# Patient Record
Sex: Male | Born: 1945 | Race: White | Hispanic: No | Marital: Married | Smoking: Never smoker
Health system: Southern US, Community
[De-identification: ages and names within clinical notes are randomized; demographics above are authoritative.]

## PROBLEM LIST (undated history)

## (undated) DIAGNOSIS — E119 Type 2 diabetes mellitus without complications: Secondary | ICD-10-CM

## (undated) DIAGNOSIS — T7840XA Allergy, unspecified, initial encounter: Secondary | ICD-10-CM

## (undated) DIAGNOSIS — E785 Hyperlipidemia, unspecified: Secondary | ICD-10-CM

## (undated) DIAGNOSIS — I10 Essential (primary) hypertension: Secondary | ICD-10-CM

## (undated) DIAGNOSIS — G4730 Sleep apnea, unspecified: Secondary | ICD-10-CM

## (undated) DIAGNOSIS — F32A Depression, unspecified: Secondary | ICD-10-CM

## (undated) DIAGNOSIS — D649 Anemia, unspecified: Secondary | ICD-10-CM

## (undated) DIAGNOSIS — K219 Gastro-esophageal reflux disease without esophagitis: Secondary | ICD-10-CM

## (undated) DIAGNOSIS — F419 Anxiety disorder, unspecified: Secondary | ICD-10-CM

## (undated) HISTORY — DX: Sleep apnea, unspecified: G47.30

## (undated) HISTORY — DX: Anxiety disorder, unspecified: F41.9

## (undated) HISTORY — DX: Allergy, unspecified, initial encounter: T78.40XA

## (undated) HISTORY — DX: Type 2 diabetes mellitus without complications: E11.9

## (undated) HISTORY — DX: Hyperlipidemia, unspecified: E78.5

## (undated) HISTORY — PX: LUMBAR DISC SURGERY: SHX700

## (undated) HISTORY — DX: Depression, unspecified: F32.A

## (undated) HISTORY — DX: Essential (primary) hypertension: I10

## (undated) HISTORY — DX: Gastro-esophageal reflux disease without esophagitis: K21.9

## (undated) HISTORY — DX: Anemia, unspecified: D64.9

---
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
## 1969-06-11 DIAGNOSIS — Z87442 Personal history of urinary calculi: Secondary | ICD-10-CM

## 1969-06-11 HISTORY — DX: Personal history of urinary calculi: Z87.442

## 2020-11-18 LAB — CBC AND DIFFERENTIAL
HCT: 31 — AB (ref 41–53)
Hemoglobin: 9.8 — AB (ref 13.5–17.5)
Neutrophils Absolute: 5.14
Platelets: 404 — AB (ref 150–399)
WBC: 8.7

## 2020-11-18 LAB — BASIC METABOLIC PANEL
BUN: 12 (ref 4–21)
CO2: 24 — AB (ref 13–22)
Chloride: 102 (ref 99–108)
Creatinine: 1.2 (ref 0.6–1.3)
Glucose: 118
Potassium: 4.7 (ref 3.4–5.3)
Sodium: 138 (ref 137–147)

## 2020-11-18 LAB — HEPATIC FUNCTION PANEL
ALT: 7 — AB (ref 10–40)
AST: 13 — AB (ref 14–40)
Alkaline Phosphatase: 57 (ref 25–125)
Bilirubin, Total: 0.7

## 2020-11-18 LAB — HEMOGLOBIN A1C: Hemoglobin A1C: 6.7

## 2020-11-18 LAB — TSH: TSH: 1.17 (ref 0.41–5.90)

## 2020-11-18 LAB — COMPREHENSIVE METABOLIC PANEL
Albumin: 4.4 (ref 3.5–5.0)
Calcium: 10.3 (ref 8.7–10.7)
Globulin: 2.5

## 2020-11-18 LAB — CBC: RBC: 4.47 (ref 3.87–5.11)

## 2021-04-19 ENCOUNTER — Other Ambulatory Visit: Payer: Self-pay

## 2021-04-19 ENCOUNTER — Encounter: Payer: Self-pay | Admitting: Internal Medicine

## 2021-04-19 ENCOUNTER — Non-Acute Institutional Stay: Payer: Medicare Other | Admitting: Internal Medicine

## 2021-04-19 VITALS — BP 111/67 | HR 84 | Temp 96.3°F | Ht 71.0 in | Wt 181.6 lb

## 2021-04-19 DIAGNOSIS — R42 Dizziness and giddiness: Secondary | ICD-10-CM | POA: Diagnosis not present

## 2021-04-19 DIAGNOSIS — E118 Type 2 diabetes mellitus with unspecified complications: Secondary | ICD-10-CM

## 2021-04-19 DIAGNOSIS — R0602 Shortness of breath: Secondary | ICD-10-CM

## 2021-04-19 DIAGNOSIS — F419 Anxiety disorder, unspecified: Secondary | ICD-10-CM

## 2021-04-19 DIAGNOSIS — R350 Frequency of micturition: Secondary | ICD-10-CM

## 2021-04-19 DIAGNOSIS — N401 Enlarged prostate with lower urinary tract symptoms: Secondary | ICD-10-CM

## 2021-04-19 DIAGNOSIS — D649 Anemia, unspecified: Secondary | ICD-10-CM | POA: Diagnosis not present

## 2021-04-19 DIAGNOSIS — I10 Essential (primary) hypertension: Secondary | ICD-10-CM

## 2021-04-19 DIAGNOSIS — F339 Major depressive disorder, recurrent, unspecified: Secondary | ICD-10-CM

## 2021-04-19 DIAGNOSIS — K219 Gastro-esophageal reflux disease without esophagitis: Secondary | ICD-10-CM

## 2021-04-19 NOTE — Patient Instructions (Addendum)
Call with pharmacy info.  Meclizine 12.5 Mg TID  Prn for Dizziness. It will make you sleepy so don't drive after that for few hours. Let me know on next visit if it helps with dizziness Change you metformin to Half tablet twice a day. See if it helps GI symptoms

## 2021-04-20 MED ORDER — MECLIZINE HCL 12.5 MG PO TABS: 12.5000 mg | ORAL_TABLET | Freq: Three times a day (TID) | ORAL | 0 refills | Status: DC | PRN

## 2021-04-20 MED ORDER — ROSUVASTATIN CALCIUM 10 MG PO TABS: 10.0000 mg | ORAL_TABLET | Freq: Every day | ORAL | 3 refills | Status: DC

## 2021-04-20 NOTE — Progress Notes (Signed)
Location:  [place] of Service:  Clinic (12)  Provider:   Code Status:  Goals of Care:  Advanced Directives 04/18/2021  Does Patient Have a Medical Advance Directive? Yes  Type of Advance Directive Living will;Healthcare Power of Attorney  Does patient want to make changes to medical advance directive? No - Patient declined  Copy of [place] in Chart? No - copy requested     Chief Complaint  Patient presents with   Medical Management of Chronic Issues    Patient here today to establish care.     HPI: Patient is a 75 y.o. male seen today for medical management of chronic diseases.    Recent move to [facility] from [place] with his wife Here to establish care Dont have records from previous PCP Dizziness C/o Room Spinning Sometimes whole day Feels like he is going to fall. Has not fallen. Going on for past few months Worsen when he stands up or any activity Nausea and Abdominal Fullness Has been going on for past few years No change in bowel habit no Abdominal pain Weight is at baseline Appetite is good No Vomiting Fatigue. Feels like that all day SOB Only when he tries to move heavy objects No Chest pain or Cough or Fever or DOE Depression with anxiety. Has talked to psychologist before Stress due to his children and recent move from [place] frequency Goes to Bathroom at night 7-8 times H/o Diabetes Mellitus type 2 CBG at home have been around 100-120  Moved here with his wife Independent in his ADLS and [place] with no assist Very sedentary   Past Medical History:  Diagnosis Date   Diabetes mellitus without complication ([place])    Hyperlipidemia    Hypertension     Past Surgical History:  Procedure Laterality Date   APPENDECTOMY  1952    No Known Allergies  Outpatient Encounter Medications as of 04/19/2021  Medication Sig   meclizine (ANTIVERT) 12.5 MG tablet Take 1 tablet (12.5 mg total) by  mouth 3 (three) times daily as needed for dizziness.   metFORMIN (GLUCOPHAGE) 1000 MG tablet Take 500 mg by mouth 2 (two) times daily with a meal.   pantoprazole (PROTONIX) 40 MG tablet Take 40 mg by mouth daily.   ramipril (ALTACE) 5 MG capsule Take 5 mg by mouth daily.   rosuvastatin (CRESTOR) 10 MG tablet Take 1 tablet (10 mg total) by mouth daily.   sertraline (ZOLOFT) 50 MG tablet Take 50 mg by mouth daily.   tamsulosin (FLOMAX) 0.4 MG CAPS capsule Take 0.4 mg by mouth at bedtime.   [DISCONTINUED] meclizine (ANTIVERT) 12.5 MG tablet Take 1 tablet (12.5 mg total) by mouth 3 (three) times daily as needed for dizziness.   [DISCONTINUED] rosuvastatin (CRESTOR) 10 MG tablet Take 10 mg by mouth daily.   [DISCONTINUED] rosuvastatin (CRESTOR) 10 MG tablet Take 1 tablet (10 mg total) by mouth daily.   No facility-administered encounter medications on file as of 04/19/2021.    Review of Systems:  Review of Systems  Constitutional:  Positive for activity change.  HENT: Negative.    Respiratory:  Positive for shortness of breath.   Cardiovascular: Negative.   Gastrointestinal:  Positive for abdominal distention and nausea.  Genitourinary:  Positive for frequency and urgency.  Musculoskeletal: Negative.   Skin: Negative.   Neurological:  Positive for dizziness and weakness.  Psychiatric/Behavioral:  Positive for dysphoric mood and sleep disturbance. The patient is  nervous/anxious.    Health Maintenance  Topic Date Due   COVID-19 Vaccine (1) Never done   Pneumonia Vaccine 44+ Years old (1 - PCV) Never done   Hepatitis C Screening  Never done   TETANUS/TDAP  Never done   Zoster Vaccines- Shingrix (1 of 2) Never done   INFLUENZA VACCINE  01/09/2021   HPV VACCINES  Aged Out    Physical Exam: Vitals:   04/19/21 1536  BP: 111/67  Pulse: 84  Temp: (!) 96.3 F (35.7 C)  SpO2: 96%  Weight: 181 lb 9.6 oz (82.4 kg)  Height: 5\' 11"  (1.803 m)   Body mass index is 25.33 kg/m. Physical  Exam Vitals reviewed.  Constitutional:      Appearance: Normal appearance.  HENT:     Head: Normocephalic.     Nose: Nose normal.     Mouth/Throat:     Mouth: Mucous membranes are moist.     Pharynx: Oropharynx is clear.  Eyes:     Pupils: Pupils are equal, round, and reactive to light.  Cardiovascular:     Rate and Rhythm: Normal rate and regular rhythm.     Pulses: Normal pulses.     Heart sounds: Normal heart sounds. No murmur heard. Pulmonary:     Effort: Pulmonary effort is normal.     Breath sounds: Normal breath sounds.  Abdominal:     General: Abdomen is flat. Bowel sounds are normal.     Palpations: Abdomen is soft.  Musculoskeletal:        General: No swelling.     Cervical back: Neck supple.  Skin:    General: Skin is warm.  Neurological:     General: No focal deficit present.     Mental Status: He is alert and oriented to person, place, and time.     Comments: No Nystagmus Romberg was negative Did not feel dizzy when he stood up nor with the movement of his head   Psychiatric:        Mood and Affect: Mood normal.        Thought Content: Thought content normal.    Labs reviewed: Basic Metabolic Panel: Recent Labs    11/19/20 0000  NA 138  K 4.7  CL 102  CO2 24*  BUN 12  CREATININE 1.2  CALCIUM 10.3  TSH 1.17   Liver Function Tests: Recent Labs    11/19/20 0000  AST 13*  ALT 7*  ALKPHOS 57  ALBUMIN 4.4   No results for input(s): LIPASE, AMYLASE in the last 8760 hours. No results for input(s): AMMONIA in the last 8760 hours. CBC: Recent Labs    11/19/20 0000  WBC 8.7  NEUTROABS 5.14  HGB 9.8*  HCT 31*  PLT 404*   Lipid Panel: No results for input(s): CHOL, HDL, LDLCALC, TRIG, CHOLHDL, LDLDIRECT in the last 8760 hours. Lab Results  Component Value Date   HGBA1C 6.7 11/19/2020    Procedures since last visit: No results found.  Assessment/Plan 1. Anemia, unspecified type ? Etiology Has not worked up per Patient  No GI work  up per him - CBC with Differential/Platelet; Future - Iron, TIBC and Ferritin Panel; Future - Vitamin B12; Future - Folate RBC; Future  2. Dizziness ? BPPV Will try Meclizine PRN I fno Improvement Neurology Referal - COMPLETE METABOLIC PANEL WITH GFR; Future  3. Controlled type 2 diabetes mellitus with complication, without long-term current use of insulin (HCC) Reduce Metformin to 500 mg BID to see if  it helps Gi symptoms - Hemoglobin A1c; Future - Lipid panel; Future  4. Primary hypertension On Low dose of Ramipril  5. Depression, recurrent ([place]) Continue same dose of Zoloft  6. Anxiety Due to recent move and Personal Stress  7. SOB (shortness of breath)  NoN specific Symptoms Exam normal Will start with labs first  8. Benign prostatic hyperplasia with urinary frequency Will need Urology Referal as Flomax not helping  9. Gastroesophageal reflux disease, unspecified whether esophagitis present Continue Protonix 10 GI non specific Symptoms ? Reducing dose for Metformin If no Improvement Consider GI consult especially with Anemia 11. HLD On statin   Labs/tests ordered:  * No order type specified * Next appt:  04/27/2021

## 2021-04-27 ENCOUNTER — Other Ambulatory Visit: Payer: Self-pay

## 2021-04-27 DIAGNOSIS — R42 Dizziness and giddiness: Secondary | ICD-10-CM

## 2021-04-27 DIAGNOSIS — D649 Anemia, unspecified: Secondary | ICD-10-CM

## 2021-04-27 DIAGNOSIS — E118 Type 2 diabetes mellitus with unspecified complications: Secondary | ICD-10-CM

## 2021-04-28 ENCOUNTER — Other Ambulatory Visit: Payer: Self-pay | Admitting: Internal Medicine

## 2021-04-28 DIAGNOSIS — D649 Anemia, unspecified: Secondary | ICD-10-CM

## 2021-04-28 DIAGNOSIS — K219 Gastro-esophageal reflux disease without esophagitis: Secondary | ICD-10-CM

## 2021-05-03 LAB — COMPLETE METABOLIC PANEL WITH GFR
AG Ratio: 1.4 (calc) (ref 1.0–2.5)
ALT: 8 U/L — ABNORMAL LOW (ref 9–46)
AST: 13 U/L (ref 10–35)
Albumin: 4.1 g/dL (ref 3.6–5.1)
Alkaline phosphatase (APISO): 51 U/L (ref 35–144)
BUN: 11 mg/dL (ref 7–25)
CO2: 24 mmol/L (ref 20–32)
Calcium: 10.6 mg/dL — ABNORMAL HIGH (ref 8.6–10.3)
Chloride: 103 mmol/L (ref 98–110)
Creat: 1.14 mg/dL (ref 0.70–1.28)
Globulin: 2.9 g/dL (calc) (ref 1.9–3.7)
Glucose, Bld: 112 mg/dL — ABNORMAL HIGH (ref 65–99)
Potassium: 5.3 mmol/L (ref 3.5–5.3)
Sodium: 137 mmol/L (ref 135–146)
Total Bilirubin: 0.3 mg/dL (ref 0.2–1.2)
Total Protein: 7 g/dL (ref 6.1–8.1)
eGFR: 66 mL/min/{1.73_m2} (ref >=60)

## 2021-05-03 LAB — IRON,TIBC AND FERRITIN PANEL
%SAT: 3 % (calc) — ABNORMAL LOW (ref 20–48)
Ferritin: 4 ng/mL — ABNORMAL LOW (ref 24–380)
Iron: 14 ug/dL — ABNORMAL LOW (ref 50–180)
TIBC: 425 mcg/dL (calc) (ref 250–425)

## 2021-05-03 LAB — HEMOGLOBIN A1C
Hgb A1c MFr Bld: 6.5 % of total Hgb — ABNORMAL HIGH (ref <5.7)
Mean Plasma Glucose: 140 mg/dL
eAG (mmol/L): 7.7 mmol/L

## 2021-05-03 LAB — CBC WITH DIFFERENTIAL/PLATELET
Absolute Monocytes: 679 cells/uL (ref 200–950)
Basophils Absolute: 78 cells/uL (ref 0–200)
Basophils Relative: 0.9 %
Eosinophils Absolute: 1044 cells/uL — ABNORMAL HIGH (ref 15–500)
Eosinophils Relative: 12 %
HCT: 31.6 % — ABNORMAL LOW (ref 38.5–50.0)
Hemoglobin: 8.9 g/dL — ABNORMAL LOW (ref 13.2–17.1)
Lymphs Abs: 1557 cells/uL (ref 850–3900)
MCH: 18.7 pg — ABNORMAL LOW (ref 27.0–33.0)
MCHC: 28.2 g/dL — ABNORMAL LOW (ref 32.0–36.0)
MCV: 66.5 fL — ABNORMAL LOW (ref 80.0–100.0)
MPV: 9.4 fL (ref 7.5–12.5)
Monocytes Relative: 7.8 %
Neutro Abs: 5342 cells/uL (ref 1500–7800)
Neutrophils Relative %: 61.4 %
Platelets: 416 10*3/uL — ABNORMAL HIGH (ref 140–400)
RBC: 4.75 10*6/uL (ref 4.20–5.80)
RDW: 17.4 % — ABNORMAL HIGH (ref 11.0–15.0)
Total Lymphocyte: 17.9 %
WBC: 8.7 10*3/uL (ref 3.8–10.8)

## 2021-05-03 LAB — FOLATE RBC

## 2021-05-03 LAB — LIPID PANEL
Cholesterol: 138 mg/dL (ref <200)
HDL: 56 mg/dL (ref >=40)
LDL Cholesterol (Calc): 61 mg/dL (calc)
Non-HDL Cholesterol (Calc): 82 mg/dL (calc) (ref <130)
Total CHOL/HDL Ratio: 2.5 (calc) (ref <5.0)
Triglycerides: 119 mg/dL (ref <150)

## 2021-05-03 LAB — VITAMIN B12: Vitamin B-12: 221 pg/mL (ref 200–1100)

## 2021-05-03 LAB — CBC MORPHOLOGY

## 2021-05-17 ENCOUNTER — Other Ambulatory Visit: Payer: Self-pay

## 2021-05-17 ENCOUNTER — Non-Acute Institutional Stay: Payer: Medicare Other | Admitting: Internal Medicine

## 2021-05-17 ENCOUNTER — Encounter: Payer: Self-pay | Admitting: Internal Medicine

## 2021-05-17 VITALS — BP 106/64 | HR 92 | Temp 96.2°F | Ht 71.0 in | Wt 189.0 lb

## 2021-05-17 DIAGNOSIS — D649 Anemia, unspecified: Secondary | ICD-10-CM

## 2021-05-17 DIAGNOSIS — E118 Type 2 diabetes mellitus with unspecified complications: Secondary | ICD-10-CM | POA: Diagnosis not present

## 2021-05-17 DIAGNOSIS — K219 Gastro-esophageal reflux disease without esophagitis: Secondary | ICD-10-CM

## 2021-05-17 DIAGNOSIS — E538 Deficiency of other specified B group vitamins: Secondary | ICD-10-CM

## 2021-05-17 DIAGNOSIS — R42 Dizziness and giddiness: Secondary | ICD-10-CM

## 2021-05-17 DIAGNOSIS — R1084 Generalized abdominal pain: Secondary | ICD-10-CM

## 2021-05-17 DIAGNOSIS — I10 Essential (primary) hypertension: Secondary | ICD-10-CM

## 2021-05-17 DIAGNOSIS — D509 Iron deficiency anemia, unspecified: Secondary | ICD-10-CM

## 2021-05-17 DIAGNOSIS — R0602 Shortness of breath: Secondary | ICD-10-CM

## 2021-05-17 DIAGNOSIS — F419 Anxiety disorder, unspecified: Secondary | ICD-10-CM

## 2021-05-17 DIAGNOSIS — F339 Major depressive disorder, recurrent, unspecified: Secondary | ICD-10-CM

## 2021-05-17 DIAGNOSIS — R350 Frequency of micturition: Secondary | ICD-10-CM

## 2021-05-17 DIAGNOSIS — N401 Enlarged prostate with lower urinary tract symptoms: Secondary | ICD-10-CM

## 2021-05-17 MED ORDER — MECLIZINE HCL 12.5 MG PO TABS: 12.5000 mg | ORAL_TABLET | Freq: Three times a day (TID) | ORAL | 0 refills | Status: DC | PRN

## 2021-05-17 NOTE — Patient Instructions (Addendum)
Start taking Iron 325 mg everyday with Food.You can take any brand like Petra Kuba Brand Vit B12 1000 mcg Oral every day I am ordering CT scan of Abdomen  I am also making Gastroenterologist Referral Can take Meclizine 12.5 mg as needed for dizziness.

## 2021-05-19 NOTE — Progress Notes (Signed)
Location: [place] of Service:  Clinic (12)  Provider:   Code Status:  Goals of Care:  Advanced Directives 05/17/2021  Does Patient Have a Medical Advance Directive? Yes  Type of Advance Directive Living will;Healthcare Power of Attorney  Does patient want to make changes to medical advance directive? No - Patient declined  Copy of [place] in Chart? No - copy requested     Chief Complaint  Patient presents with   Medical Management of Chronic Issues    Patient returns to the clinic for 4 week follow up. He has stopped the meclizine and went back to 500 mg BID on his metformin.     HPI: Patient is a 75 y.o. male seen today for an acute visit for Iron Def anemia and review all issues Recent move to [facility] from [place] with his wife Here to establish care Dont have records from previous PCP Labs showed  Iron deficiency anemia I called patient and told him to start iron but he never did. He also also did not start B12. States that he has been feeling like this for many years and does not think this is anything to worry about.  He is not gotten a call from GI either.  Patient also does not seem to be very enthusiastic about seeing a specialist.  Gets very upset when I tell him that he needs further work-up for this.  He states that he has recently moved to [place] from [place] and has a lot of unfinished thinks that he has to take care of.  Dizziness C/o Room Spinning Sometimes whole day Feels like he is going to fall. Has not fallen. Going on for past few months Worsen when he stands up or any activity  Did improve with meclizine and wants to use that as needed  Nausea and Abdominal Fullness Has been going on for past few years No change in bowel habit no Abdominal pain Weight is at baseline Tried to decrease his metformin but he is back on his baseline dose of 1000 mg twice daily.  Other issues SOB Only when he tries to  move heavy objects No Chest pain or Cough or Fever or DOE Depression with anxiety. Has talked to psychologist before Stress due to his children and recent move from [place] frequency Goes to Bathroom at night 7-8 times H/o Diabetes Mellitus type 2 CBG at home have been around 100-120   Moved here with his wife Independent in his ADLS and [place] with no assist  Past Medical History:  Diagnosis Date   Diabetes mellitus without complication ([place])    Hyperlipidemia    Hypertension     Past Surgical History:  Procedure Laterality Date   APPENDECTOMY  1952    No Known Allergies  Outpatient Encounter Medications as of 05/17/2021  Medication Sig   meclizine (ANTIVERT) 12.5 MG tablet Take 1 tablet (12.5 mg total) by mouth 3 (three) times daily as needed for dizziness.   metFORMIN (GLUCOPHAGE) 1000 MG tablet Take 1,000 mg by mouth 2 (two) times daily with a meal.   pantoprazole (PROTONIX) 40 MG tablet Take 40 mg by mouth daily.   ramipril (ALTACE) 5 MG capsule Take 5 mg by mouth daily.   rosuvastatin (CRESTOR) 10 MG tablet Take 1 tablet (10 mg total) by mouth daily.   sertraline (ZOLOFT) 50 MG tablet Take 50 mg by mouth daily.   tamsulosin (FLOMAX) 0.4 MG CAPS  capsule Take 0.4 mg by mouth at bedtime.   [DISCONTINUED] meclizine (ANTIVERT) 12.5 MG tablet Take 1 tablet (12.5 mg total) by mouth 3 (three) times daily as needed for dizziness.   No facility-administered encounter medications on file as of 05/17/2021.    Review of Systems:  Review of Systems  Constitutional:  Negative for activity change, appetite change and unexpected weight change.  HENT: Negative.    Respiratory:  Positive for shortness of breath. Negative for cough.   Cardiovascular:  Negative for leg swelling.  Gastrointestinal:  Positive for abdominal distention. Negative for constipation.       C/o Abdominal Discomfort and distension  Genitourinary:  Negative for frequency.  Musculoskeletal:  Negative  for arthralgias, gait problem and myalgias.  Skin: Negative.  Negative for rash.  Neurological:  Positive for dizziness and weakness.  Psychiatric/Behavioral:  Positive for dysphoric mood. Negative for confusion and sleep disturbance.   All other systems reviewed and are negative.  Health Maintenance  Topic Date Due   COVID-19 Vaccine (1) Never done   Pneumonia Vaccine 60+ Years old (1 - PCV) Never done   Hepatitis C Screening  Never done   TETANUS/TDAP  Never done   Zoster Vaccines- Shingrix (1 of 2) Never done   INFLUENZA VACCINE  01/09/2021   HPV VACCINES  Aged Out    Physical Exam: Vitals:   05/17/21 1629  BP: 106/64  Pulse: 92  Temp: (!) 96.2 F (35.7 C)  SpO2: 96%  Weight: 189 lb (85.7 kg)  Height: 5\' 11"  (1.803 m)   Body mass index is 26.36 kg/m. Physical Exam Vitals reviewed.  Constitutional:      Appearance: Normal appearance.  HENT:     Head: Normocephalic.     Mouth/Throat:     Mouth: Mucous membranes are moist.     Pharynx: Oropharynx is clear.  Eyes:     Pupils: Pupils are equal, round, and reactive to light.  Cardiovascular:     Rate and Rhythm: Normal rate and regular rhythm.     Pulses: Normal pulses.     Heart sounds: No murmur heard. Pulmonary:     Effort: Pulmonary effort is normal. No respiratory distress.     Breath sounds: Normal breath sounds. No rales.  Abdominal:     General: Abdomen is flat. Bowel sounds are normal.     Palpations: Abdomen is soft.  Musculoskeletal:        General: No swelling.     Cervical back: Neck supple.  Skin:    General: Skin is warm.  Neurological:     General: No focal deficit present.     Mental Status: He is alert and oriented to person, place, and time.  Psychiatric:        Mood and Affect: Mood normal.        Thought Content: Thought content normal.    Labs reviewed: Basic Metabolic Panel: Recent Labs    11/18/20 0000 04/26/21 0820  NA 138 137  K 4.7 5.3  CL 102 103  CO2 24* 24  GLUCOSE   --  112*  BUN 12 11  CREATININE 1.2 1.14  CALCIUM 10.3 10.6*  TSH 1.17  --    Liver Function Tests: Recent Labs    11/18/20 0000 04/26/21 0820  AST 13* 13  ALT 7* 8*  ALKPHOS 57  --   BILITOT  --  0.3  PROT  --  7.0  ALBUMIN 4.4  --    No results for  input(s): LIPASE, AMYLASE in the last 8760 hours. No results for input(s): AMMONIA in the last 8760 hours. CBC: Recent Labs    11/18/20 0000 04/26/21 0820  WBC 8.7 8.7  NEUTROABS 5.14 5,342  HGB 9.8* 8.9*  HCT 31* 31.6*  MCV  --  66.5*  PLT 404* 416*   Lipid Panel: Recent Labs    04/26/21 0820  CHOL 138  HDL 56  LDLCALC 61  TRIG 119  CHOLHDL 2.5   Lab Results  Component Value Date   HGBA1C 6.5 (H) 04/26/2021    Procedures since last visit: No results found.  Assessment/Plan 1. Iron deficiency anemia, unspecified iron deficiency anemia type Repeat CBC after starting iron. If no improvement send to Hematology for infusions Refuses to follow up and labs over holidays as as recently moved here and wants to spend time with his Children in different states  - Ambulatory referral to Gastroenterology - CT Abdomen Pelvis W Contrast; Future  2. Generalized abdominal discomfort Says this is chronic issue Has not had any scopes done recently Not very happy that needs to see GI with his risk of Cancer due to Iron Def anemia  - CT Abdomen Pelvis W Contrast; Future  3. Dizziness Meclizine helped Wants to take it Prn Can be related to his anemia If continues will need Neurology Referral Does not want it  right now  4. Controlled type 2 diabetes mellitus with complication, without long-term current use of insulin (HCC) Changed Metformin back to 1000 mg BID Says he loves to eat sweets and candy and not going to change that Can live with Abdominal discomfort 5. Depression, recurrent ([place]) On Zoloft but continues to show symptoms of depression Keeps saying he does not want to see lot of doctors or need test Gets  very upset if I bring it up. 6. Primary hypertension Continue Altace  7. Anxiety   8. SOB (shortness of breath) on exertion Can be related to low Hgb Will continue to follow  9. Benign prostatic hyperplasia with urinary frequency On Flomax Does not want Urology right now  10. Gastroesophageal reflux disease, unspecified whether esophagitis present On Protonix 11 Vit B12 def Started on B12 PO 12 HLD On statin  Labs/tests ordered:  * No order type specified * Next appt:  07/13/2021

## 2021-05-29 ENCOUNTER — Ambulatory Visit (INDEPENDENT_AMBULATORY_CARE_PROVIDER_SITE_OTHER): Payer: Medicare Other | Admitting: Gastroenterology

## 2021-05-29 ENCOUNTER — Encounter: Payer: Self-pay | Admitting: Gastroenterology

## 2021-05-29 VITALS — BP 100/50 | HR 100 | Ht 71.0 in | Wt 194.2 lb

## 2021-05-29 DIAGNOSIS — D509 Iron deficiency anemia, unspecified: Secondary | ICD-10-CM | POA: Diagnosis not present

## 2021-05-29 NOTE — Progress Notes (Signed)
HPI : Bryan Stafford is a pleasant 75 year old male with diabetes who is referred to [place] by Dr. Veleta Miners for iron deficiency anemia.  The patient recently moved to [place] from [place] and I don't have access to previous medical records at the time of this visit.  In June his hgb was 9.8.  In November, his hgb was 8.9 with an MCV 66 (baseline unknown).  Iron panel consistent with severe iron deficiency (ferritin 4, Sat 3%, serum iron 14).  He says he has never been he's had low iron or been told his anemic in the past.  He had a colonoscopy well over 10 years ago, thinks it was normal, but not sure.  He denies hematochezia, melena, abdominal pain, dysphagia, nausea/vomiting.  He report feeling bloated all the time.  His appetite is down and he has been having new constipation manifested as straining with bowel movements and less frequent bowel movements (used to be daily, now every other day).  His stools typically have always been loose and poorly formed, but lately have been solid and very large in volume. He is on a lot more medications now, and has attributed his change in bowel habits to this. He does report feeling tired all the time and he gets winded very easily.  Sometimes he gets light-headed when standing.  No syncopal episodes.  No symptoms at rest.  He is taking oral iron supplements now. He is scheduled for a CT scan of the abdomen and pelvis next month. He reports a lot of stress associated with the move from [place].   Past Medical History:  Diagnosis Date   Depression    Diabetes mellitus without complication (HCC)    GERD (gastroesophageal reflux disease)    Hyperlipidemia    Hypertension      Past Surgical History:  Procedure Laterality Date   APPENDECTOMY  1952   LUMBAR DISC SURGERY     Family History  Problem Relation Age of Onset   Heart failure Mother    Hypertension Mother    Hyperlipidemia Mother    Heart disease Father    Hypertension Father    Hyperlipidemia  Father    Sleep apnea Sister    COPD Sister    Social History   Tobacco Use   Smoking status: Never    Passive exposure: Never   Smokeless tobacco: Never  Vaping Use   Vaping Use: Never used  Substance Use Topics   Alcohol use: Yes    Alcohol/week: 1.0 standard drink    Types: 1 Standard drinks or equivalent per week    Comment: wine occasional   Current Outpatient Medications  Medication Sig Dispense Refill   meclizine (ANTIVERT) 12.5 MG tablet Take 1 tablet (12.5 mg total) by mouth 3 (three) times daily as needed for dizziness. 30 tablet 0   metFORMIN (GLUCOPHAGE) 1000 MG tablet Take 1,000 mg by mouth 2 (two) times daily with a meal.     pantoprazole (PROTONIX) 40 MG tablet Take 40 mg by mouth daily.     ramipril (ALTACE) 5 MG capsule Take 5 mg by mouth daily.     rosuvastatin (CRESTOR) 10 MG tablet Take 1 tablet (10 mg total) by mouth daily. 30 tablet 3   sertraline (ZOLOFT) 50 MG tablet Take 50 mg by mouth daily.     tamsulosin (FLOMAX) 0.4 MG CAPS capsule Take 0.4 mg by mouth at bedtime.     No current facility-administered medications for this visit.  No Known Allergies   Review of Systems: All systems reviewed and negative except where noted in HPI.    No results found.  Physical Exam: BP (!) 100/50 (BP Location: Left Arm, Patient Position: Sitting, Cuff Size: Normal)    Pulse 100    Ht 5\' 11"  (1.803 m)    Wt 194 lb 4 oz (88.1 kg)    BMI 27.09 kg/m  Constitutional: Pleasant,well-developed, Caucasian male in no acute distress. HEENT: Normocephalic and atraumatic. Conjunctivae are pale. No scleral icterus. Cardiovascular: Normal rate, regular rhythm.  Pulmonary/chest: Effort normal and breath sounds normal. No wheezing, rales or rhonchi. Abdominal: Soft, nondistended, nontender. Bowel sounds active throughout. There are no masses palpable. No hepatomegaly. Extremities: no edema Neurological: Alert and oriented to person place and time. Skin: Skin is warm and  dry. No rashes noted. Psychiatric: Normal mood and affect. Behavior is normal.  CBC    Component Value Date/Time   WBC 8.7 04/26/2021 0820   RBC 4.75 04/26/2021 0820   HGB 8.9 (L) 04/26/2021 0820   HCT 31.6 (L) 04/26/2021 0820   PLT 416 (H) 04/26/2021 0820   MCV 66.5 (L) 04/26/2021 0820   MCH 18.7 (L) 04/26/2021 0820   MCHC 28.2 (L) 04/26/2021 0820   RDW 17.4 (H) 04/26/2021 0820   LYMPHSABS 1,557 04/26/2021 0820   EOSABS 1,044 (H) 04/26/2021 0820   BASOSABS 78 04/26/2021 0820    CMP     Component Value Date/Time   NA 137 04/26/2021 0820   NA 138 11/18/2020 0000   K 5.3 04/26/2021 0820   CL 103 04/26/2021 0820   CO2 24 04/26/2021 0820   GLUCOSE 112 (H) 04/26/2021 0820   BUN 11 04/26/2021 0820   BUN 12 11/18/2020 0000   CREATININE 1.14 04/26/2021 0820   CALCIUM 10.6 (H) 04/26/2021 0820   PROT 7.0 04/26/2021 0820   ALBUMIN 4.4 11/18/2020 0000   AST 13 04/26/2021 0820   ALT 8 (L) 04/26/2021 0820   ALKPHOS 57 11/18/2020 0000   BILITOT 0.3 04/26/2021 0820     ASSESSMENT AND PLAN: 75 year old male with new iron deficiency anemia without GI blood loss but with recent changes in bowel habits and weight loss.  This presentation is very concerning for a GI malignancy.  I recommended an EGD and colonoscopy.  The patient was not willing to commit to a colonoscopy at this time, even though he was informed that colon cancer is perhaps the most likely explanation for his anemia.  The patient preferred to proceed with the CT scan that is already ordered and he was agreeable to undergoing an EGD, and then he would reconsider a colonoscopy after that. Although I do not think this is the best plan, the patient could not be convinced to complete a colonoscopy at this time.  Will therefore schedule for EGD alone.  If patient changes his mind, we can always schedule a colonoscopy.  Iron deficiency anemia - EGD - Colonoscopy declined by patient at this time  The details, risks (including  bleeding, perforation, infection, missed lesions, medication reactions and possible hospitalization or surgery if complications occur), benefits, and alternatives to EGD with possible biopsy and possible polypectomy were discussed with the patient and he consents to proceed.   Bach E. Candis Schatz, MD [facility]   CC: Virgie Dad, MD

## 2021-05-29 NOTE — Patient Instructions (Signed)
If you are age 77 or older, your body mass index should be between 23-30. Your Body mass index is 27.09 kg/m. If this is out of the aforementioned range listed, please consider follow up with your Primary Care Provider.  If you are age 13 or younger, your body mass index should be between 19-25. Your Body mass index is 27.09 kg/m. If this is out of the aformentioned range listed, please consider follow up with your Primary Care Provider.   You have been scheduled for an endoscopy. Please follow written instructions given to you at your visit today. If you use inhalers (even only as needed), please bring them with you on the day of your procedure.   The [facility] providers would like to encourage you to use [facility] to communicate with providers for non-urgent requests or questions.  Due to long hold times on the telephone, sending your provider a message by [facility] may be a faster and more efficient way to get a response.  Please allow 48 business hours for a response.  Please remember that this is for non-urgent requests.   It was a pleasure to see you today!  Thank you for trusting me with your gastrointestinal care!    Dowen E. Candis Schatz, MD

## 2021-06-21 ENCOUNTER — Other Ambulatory Visit: Payer: Self-pay

## 2021-06-21 ENCOUNTER — Ambulatory Visit
Admission: RE | Admit: 2021-06-21 | Discharge: 2021-06-21 | Disposition: A | Discharge status: Home / Self Care | Payer: Medicare Other | Source: Ambulatory Visit | Attending: Internal Medicine | Admitting: Internal Medicine

## 2021-06-21 DIAGNOSIS — R1084 Generalized abdominal pain: Secondary | ICD-10-CM

## 2021-06-21 DIAGNOSIS — D509 Iron deficiency anemia, unspecified: Secondary | ICD-10-CM

## 2021-06-21 MED ORDER — IOPAMIDOL (ISOVUE-300) INJECTION 61%
100.0000 mL | Freq: Once | INTRAVENOUS | Status: AC | PRN
  Administered 2021-06-21: 100 mL via INTRAVENOUS

## 2021-07-03 ENCOUNTER — Telehealth: Payer: Self-pay | Admitting: *Deleted

## 2021-07-03 NOTE — Telephone Encounter (Signed)
Patient called and stated that he had a CT Scan done about 2 weeks ago.  He is calling for the results.  Stated that he hasn't heard anything.  Please Advise.

## 2021-07-04 NOTE — Telephone Encounter (Signed)
Discussed the CT scan results with the Patient. Will discuss more when he comes back for his appointment.

## 2021-07-04 NOTE — Telephone Encounter (Signed)
Patient walked into office yesterday, late afternoon,  requesting results. Informed him that we have sent message to Provider.

## 2021-07-12 ENCOUNTER — Other Ambulatory Visit: Payer: Self-pay

## 2021-07-12 DIAGNOSIS — R42 Dizziness and giddiness: Secondary | ICD-10-CM

## 2021-07-12 DIAGNOSIS — R1084 Generalized abdominal pain: Secondary | ICD-10-CM

## 2021-07-12 DIAGNOSIS — D509 Iron deficiency anemia, unspecified: Secondary | ICD-10-CM

## 2021-07-13 ENCOUNTER — Other Ambulatory Visit: Payer: Self-pay

## 2021-07-13 ENCOUNTER — Encounter: Payer: Medicare Other | Admitting: Gastroenterology

## 2021-07-13 ENCOUNTER — Telehealth: Payer: Self-pay | Admitting: Gastroenterology

## 2021-07-13 NOTE — Telephone Encounter (Signed)
Called patient regarding EGD that was scheduled for today. Patient had no memory of coming to see Dr.Cunningham on 12/19. Patient thought that his CT yesterday was scheduled  was in place of Endoscopy. Patient did not remember who Dr.Cunningham was and wanted to speak to Dr.Gupta before rescheduling his procedure. Patient was confused on why he needed to see [place] and why he needed EGD. Patient asked me to call back on the 2/9.

## 2021-07-17 ENCOUNTER — Other Ambulatory Visit: Payer: Self-pay

## 2021-07-17 LAB — CBC WITH DIFFERENTIAL/PLATELET
Absolute Monocytes: 714 cells/uL (ref 200–950)
Basophils Absolute: 95 cells/uL (ref 0–200)
Basophils Relative: 1.1 %
Eosinophils Absolute: 851 cells/uL — ABNORMAL HIGH (ref 15–500)
Eosinophils Relative: 9.9 %
HCT: 36.1 % — ABNORMAL LOW (ref 38.5–50.0)
Hemoglobin: 10.3 g/dL — ABNORMAL LOW (ref 13.2–17.1)
Lymphs Abs: 1789 cells/uL (ref 850–3900)
MCH: 20.3 pg — ABNORMAL LOW (ref 27.0–33.0)
MCHC: 28.5 g/dL — ABNORMAL LOW (ref 32.0–36.0)
MCV: 71.1 fL — ABNORMAL LOW (ref 80.0–100.0)
MPV: 10.1 fL (ref 7.5–12.5)
Monocytes Relative: 8.3 %
Neutro Abs: 5151 cells/uL (ref 1500–7800)
Neutrophils Relative %: 59.9 %
Platelets: 359 10*3/uL (ref 140–400)
RBC: 5.08 10*6/uL (ref 4.20–5.80)
RDW: 22.7 % — ABNORMAL HIGH (ref 11.0–15.0)
Total Lymphocyte: 20.8 %
WBC: 8.6 10*3/uL (ref 3.8–10.8)

## 2021-07-17 LAB — COMPLETE METABOLIC PANEL WITH GFR
AG Ratio: 1.5 (calc) (ref 1.0–2.5)
ALT: 9 U/L (ref 9–46)
AST: 15 U/L (ref 10–35)
Albumin: 4.1 g/dL (ref 3.6–5.1)
Alkaline phosphatase (APISO): 42 U/L (ref 35–144)
BUN: 14 mg/dL (ref 7–25)
CO2: 27 mmol/L (ref 20–32)
Calcium: 10.3 mg/dL (ref 8.6–10.3)
Chloride: 102 mmol/L (ref 98–110)
Creat: 1.24 mg/dL (ref 0.70–1.28)
Globulin: 2.8 g/dL (calc) (ref 1.9–3.7)
Glucose, Bld: 110 mg/dL — ABNORMAL HIGH (ref 65–99)
Potassium: 5.2 mmol/L (ref 3.5–5.3)
Sodium: 138 mmol/L (ref 135–146)
Total Bilirubin: 0.4 mg/dL (ref 0.2–1.2)
Total Protein: 6.9 g/dL (ref 6.1–8.1)
eGFR: 60 mL/min/{1.73_m2} (ref >=60)

## 2021-07-17 LAB — CBC MORPHOLOGY

## 2021-07-18 ENCOUNTER — Telehealth: Payer: Self-pay

## 2021-07-18 NOTE — Telephone Encounter (Signed)
Patient called stating he has not had his endoscopy done, although it is scheduled for March 21 st. Patient is currently scheduled for tomorrow. Patient did have labs done. He would like to confirm he should keep this appointment or reschedule it for after endoscopy? [phone]  To Dr. Lyndel Safe

## 2021-07-18 NOTE — Telephone Encounter (Signed)
He needs to keep his appointment. Tell him I have to talk to him about his Blood work and CT Scan report again

## 2021-07-18 NOTE — Telephone Encounter (Signed)
Patient aware.

## 2021-07-19 ENCOUNTER — Encounter: Payer: Self-pay | Admitting: Internal Medicine

## 2021-07-19 ENCOUNTER — Non-Acute Institutional Stay: Payer: Medicare Other | Admitting: Internal Medicine

## 2021-07-19 ENCOUNTER — Other Ambulatory Visit: Payer: Self-pay

## 2021-07-19 VITALS — BP 138/77 | HR 79 | Temp 97.9°F | Ht 71.0 in | Wt 204.5 lb

## 2021-07-19 DIAGNOSIS — F419 Anxiety disorder, unspecified: Secondary | ICD-10-CM

## 2021-07-19 DIAGNOSIS — E538 Deficiency of other specified B group vitamins: Secondary | ICD-10-CM

## 2021-07-19 DIAGNOSIS — F339 Major depressive disorder, recurrent, unspecified: Secondary | ICD-10-CM

## 2021-07-19 DIAGNOSIS — R350 Frequency of micturition: Secondary | ICD-10-CM

## 2021-07-19 DIAGNOSIS — D509 Iron deficiency anemia, unspecified: Secondary | ICD-10-CM

## 2021-07-19 DIAGNOSIS — R42 Dizziness and giddiness: Secondary | ICD-10-CM

## 2021-07-19 DIAGNOSIS — K219 Gastro-esophageal reflux disease without esophagitis: Secondary | ICD-10-CM

## 2021-07-19 DIAGNOSIS — R1084 Generalized abdominal pain: Secondary | ICD-10-CM

## 2021-07-19 DIAGNOSIS — I10 Essential (primary) hypertension: Secondary | ICD-10-CM

## 2021-07-19 DIAGNOSIS — N401 Enlarged prostate with lower urinary tract symptoms: Secondary | ICD-10-CM

## 2021-07-19 DIAGNOSIS — E118 Type 2 diabetes mellitus with unspecified complications: Secondary | ICD-10-CM | POA: Diagnosis not present

## 2021-07-19 DIAGNOSIS — R0602 Shortness of breath: Secondary | ICD-10-CM

## 2021-07-22 NOTE — Progress Notes (Signed)
Location: [place] of Service:  Clinic (12)  Provider:   Code Status:  Goals of Care:  Advanced Directives 07/19/2021  Does Patient Have a Medical Advance Directive? Yes  Type of Paramedic of [place];Out of facility DNR (pink MOST or yellow form)  Does patient want to make changes to medical advance directive? No - Patient declined  Copy of [place] in Chart? No - copy requested     Chief Complaint  Patient presents with   Medical Management of Chronic Issues    Patient returns to the clinic for 2 month follow up.    Quality Metric Gaps    Hep. C    HPI: Patient is a 76 y.o. male seen today for an acute visit for follow up of his Iron Def Anemia   Recent move to [facility] from [place] with his wife  Dont have records from previous PCP Labs showed  Iron deficiency anemia Started on Iron CT scan of abdomen No Acute process GI Plan for EGD refused Colonoscopy right now Started Iron HGB improved MCV still low Dizziness, Fatigue has all improved now B12 def On Supplement now Also has h/o Diabetes Mellitus CBGs at home 120-140 per patient Urinary frequency, Depression  Past Medical History:  Diagnosis Date   Depression    Diabetes mellitus without complication ([place])    GERD (gastroesophageal reflux disease)    Hyperlipidemia    Hypertension     Past Surgical History:  Procedure Laterality Date   APPENDECTOMY  1952   LUMBAR [place]      No Known Allergies  Outpatient Encounter Medications as of 07/19/2021  Medication Sig   Cyanocobalamin (VITAMIN B 12 PO) Take 1,000 mcg by mouth.   ferrous sulfate 325 (65 FE) MG EC tablet Take 325 mg by mouth daily.   meclizine (ANTIVERT) 12.5 MG tablet Take 1 tablet (12.5 mg total) by mouth 3 (three) times daily as needed for dizziness.   metFORMIN (GLUCOPHAGE) 1000 MG tablet Take 1,000 mg by mouth 2 (two) times daily with a meal.   pantoprazole  (PROTONIX) 40 MG tablet Take 40 mg by mouth daily.   ramipril (ALTACE) 5 MG capsule Take 5 mg by mouth daily.   rosuvastatin (CRESTOR) 10 MG tablet Take 1 tablet (10 mg total) by mouth daily.   sertraline (ZOLOFT) 50 MG tablet Take 50 mg by mouth daily.   tamsulosin (FLOMAX) 0.4 MG CAPS capsule Take 0.4 mg by mouth at bedtime.   No facility-administered encounter medications on file as of 07/19/2021.    Review of Systems:  Review of Systems  Constitutional:  Negative for activity change, appetite change and unexpected weight change.  HENT: Negative.    Respiratory:  Negative for cough and shortness of breath.   Cardiovascular:  Negative for leg swelling.  Gastrointestinal:  Negative for constipation.  Genitourinary:  Positive for frequency.  Musculoskeletal:  Negative for arthralgias, gait problem and myalgias.  Skin: Negative.  Negative for rash.  Neurological:  Negative for dizziness and weakness.  Psychiatric/Behavioral:  Positive for dysphoric mood. Negative for confusion and sleep disturbance.   All other systems reviewed and are negative.  Health Maintenance  Topic Date Due   Hepatitis C Screening  Never done   INFLUENZA VACCINE  09/08/2021 (Originally 01/09/2021)   Zoster Vaccines- Shingrix (1 of 2) 10/16/2021 (Originally 03/30/1994)   COVID-19 Vaccine (1) 11/02/2021 (Originally 04/18/2020)   Pneumonia Vaccine 58+ Years old (  1 - PCV) 07/19/2022 (Originally 03/30/2009)   TETANUS/TDAP  07/19/2022 (Originally [birth year]/31/1965)   HPV VACCINES  Aged Out    Physical Exam: Vitals:   07/19/21 1537  BP: 138/77  Pulse: 79  Temp: 97.9 F (36.6 C)  SpO2: 97%  Weight: 204 lb 8 oz (92.8 kg)  Height: 5\' 11"  (1.803 m)   Body mass index is 28.52 kg/m. Physical Exam Vitals reviewed.  Constitutional:      Appearance: Normal appearance.  HENT:     Head: Normocephalic.     Mouth/Throat:     Mouth: Mucous membranes are moist.     Pharynx: Oropharynx is clear.  Eyes:     Pupils: Pupils  are equal, round, and reactive to light.  Cardiovascular:     Rate and Rhythm: Normal rate and regular rhythm.     Pulses: Normal pulses.     Heart sounds: No murmur heard. Pulmonary:     Effort: Pulmonary effort is normal. No respiratory distress.     Breath sounds: Normal breath sounds. No rales.  Abdominal:     General: Abdomen is flat. Bowel sounds are normal.     Palpations: Abdomen is soft.  Musculoskeletal:        General: No swelling.     Cervical back: Neck supple.  Skin:    General: Skin is warm.  Neurological:     General: No focal deficit present.     Mental Status: He is alert and oriented to person, place, and time.  Psychiatric:        Mood and Affect: Mood normal.        Thought Content: Thought content normal.    Labs reviewed: Basic Metabolic Panel: Recent Labs    11/18/20 0000 04/26/21 0820 07/17/21 0745  NA 138 137 138  K 4.7 5.3 5.2  CL 102 103 102  CO2 24* 24 27  GLUCOSE  --  112* 110*  BUN 12 11 14   CREATININE 1.2 1.14 1.24  CALCIUM 10.3 10.6* 10.3  TSH 1.17  --   --    Liver Function Tests: Recent Labs    11/18/20 0000 04/26/21 0820 07/17/21 0745  AST 13* 13 15  ALT 7* 8* 9  ALKPHOS 57  --   --   BILITOT  --  0.3 0.4  PROT  --  7.0 6.9  ALBUMIN 4.4  --   --    No results for input(s): LIPASE, AMYLASE in the last 8760 hours. No results for input(s): AMMONIA in the last 8760 hours. CBC: Recent Labs    11/18/20 0000 04/26/21 0820 07/17/21 0745  WBC 8.7 8.7 8.6  NEUTROABS 5.14 5,342 5,151  HGB 9.8* 8.9* 10.3*  HCT 31* 31.6* 36.1*  MCV  --  66.5* 71.1*  PLT 404* 416* 359   Lipid Panel: Recent Labs    04/26/21 0820  CHOL 138  HDL 56  LDLCALC 61  TRIG 119  CHOLHDL 2.5   Lab Results  Component Value Date   HGBA1C 6.5 (H) 04/26/2021    Procedures since last visit: No results found.  Assessment/Plan 1. Iron deficiency anemia, unspecified iron deficiency anemia type Taking iron now Refuses Hematology Referral Says  he does not know if he will go forward with EGD/Colon His Daughter is ED physician I have given him all info so will discuss with her making any decision Discussed risk of cancer  - COMPLETE METABOLIC PANEL WITH GFR; Future - CBC with Differential/Platelet; Future  2. Generalized abdominal  discomfort CT scan showed Constipation Can take Mirlax or Senna  3. Dizziness Resolved since Hgb has improved  4. Controlled type 2 diabetes mellitus with complication, without long-term current use of insulin (HCC)  - Hemoglobin A1c; Future - TSH; Future - Lipid panel; Future - CBC with Differential/Platelet; Future  5. Depression, recurrent ([place]) On Zoloft continues to show symptoms of depression Keeps saying he does not want to see lot of doctors or need test Gets very upset if I bring it up. Is doing slightly better now that he has [facility] to [facility]  6. Primary hypertension On Altace  7. Benign prostatic hyperplasia with urinary frequency On Flomax Refusing to see urology for frequency  8. Vitamin B12 deficiency On supplement npow  9. SOB (shortness of breath) on exertion Better since HGB improved  10. HLD Repeat Lipid  On statin   11. Gastroesophageal reflux disease, unspecified whether esophagitis present On Protonix 12. 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

## 2021-08-29 ENCOUNTER — Ambulatory Visit (AMBULATORY_SURGERY_CENTER): Payer: Medicare Other | Admitting: Gastroenterology

## 2021-08-29 ENCOUNTER — Encounter: Payer: Self-pay | Admitting: Gastroenterology

## 2021-08-29 ENCOUNTER — Other Ambulatory Visit: Payer: Self-pay | Admitting: Gastroenterology

## 2021-08-29 VITALS — BP 124/64 | HR 59 | Temp 97.5°F | Resp 16 | Ht 71.0 in | Wt 194.0 lb

## 2021-08-29 DIAGNOSIS — D509 Iron deficiency anemia, unspecified: Secondary | ICD-10-CM

## 2021-08-29 DIAGNOSIS — K31819 Angiodysplasia of stomach and duodenum without bleeding: Secondary | ICD-10-CM | POA: Diagnosis not present

## 2021-08-29 DIAGNOSIS — K3189 Other diseases of stomach and duodenum: Secondary | ICD-10-CM | POA: Diagnosis not present

## 2021-08-29 DIAGNOSIS — K317 Polyp of stomach and duodenum: Secondary | ICD-10-CM

## 2021-08-29 MED ORDER — SODIUM CHLORIDE 0.9 % IV SOLN: 500.0000 mL | Freq: Once | INTRAVENOUS | Status: DC

## 2021-08-29 NOTE — Op Note (Signed)
[facility] ?Patient Name: Bryan Stafford ?Procedure Date: 08/29/2021 9:31 AM ?MRN: [id number] ?Endoscopist: Wuertz E. Candis Schatz , MD ?Age: 76 ?Referring MD:  ?Date of Birth: 02-19-1946 ?Gender: Male ?Account #: [id number] ?Procedure:                Upper GI endoscopy ?Indications:              Iron deficiency anemia ?Medicines:                Monitored Anesthesia Care ?Procedure:                Pre-Anesthesia Assessment: ?                          - Prior to the procedure, a History and Physical  ?                          was performed, and patient medications and  ?                          allergies were reviewed. The patient's tolerance of  ?                          previous anesthesia was also reviewed. The risks  ?                          and benefits of the procedure and the sedation  ?                          options and risks were discussed with the patient.  ?                          All questions were answered, and informed consent  ?                          was obtained. Prior Anticoagulants: The patient has  ?                          taken no previous anticoagulant or antiplatelet  ?                          agents. ASA Grade Assessment: II - A patient with  ?                          mild systemic disease. After reviewing the risks  ?                          and benefits, the patient was deemed in  ?                          satisfactory condition to undergo the procedure. ?                          After obtaining informed consent, the endoscope was  ?  passed under direct vision. Throughout the  ?                          procedure, the patient's blood pressure, pulse, and  ?                          oxygen saturations were monitored continuously. The  ?                          GIF [id number] #[id number] was introduced through the  ?                          mouth, and advanced to the third part of duodenum.  ?                          The upper GI endoscopy was  accomplished without  ?                          difficulty. The patient tolerated the procedure  ?                          well. ?Scope In: ?Scope Out: ?Findings:                 The examined portions of the nasopharynx,  ?                          oropharynx and larynx were normal. ?                          The examined esophagus was normal. ?                          Diffuse mildly erythematous mucosa without bleeding  ?                          was found in the gastric body. Biopsies were taken  ?                          with a cold forceps for Helicobacter pylori  ?                          testing. Estimated blood loss was minimal. ?                          A single 4 mm sessile polyp with no bleeding and no  ?                          stigmata of recent bleeding was found in the  ?                          gastric body. The polyp was removed with a cold  ?                          snare.  Resection and retrieval were complete.  ?                          Estimated blood loss was minimal. ?                          The exam of the stomach was otherwise normal. ?                          A single 12 mm sessile polyp with no bleeding was  ?                          found in the second portion of the duodenum. The  ?                          mucosa adjacent to this polyp was previously  ?                          tattooed. ?                          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                          found in the third portion of the duodenum. ?                          The exam of the duodenum was otherwise normal. ?                          Biopsies for histology were taken with a cold  ?                          forceps in the second portion of the duodenum for  ?                          evaluation of celiac disease. Estimated blood loss  ?                          was minimal. ?Complications:            No immediate complications. ?Estimated Blood Loss:     Estimated blood loss was  minimal. ?Impression:               - The examined portions of the nasopharynx,  ?                          oropharynx and larynx were normal. ?                          - Normal esophagus. ?                          - Erythematous mucosa in the gastric body. Biopsied. ?                          -  A single gastric polyp. Resected and retrieved. ?                          - A single duodenal polyp. This will need to be  ?                          removed via endoscopic mucosal resection. ?                          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 Angioectasias can be a cause of iron  ?                          deficiency anemia, although a single lesion would  ?                          be unlikely to explain the patient's severe iron  ?                          deficiency. ?                          - Biopsies were taken with a cold forceps for  ?                          evaluation of celiac disease. ?Recommendation:           - Patient has a contact number available for  ?                          emergencies. The signs and symptoms of potential  ?                          delayed complications were discussed with the  ?                          patient. Return to normal activities tomorrow.  ?                          Written discharge instructions were provided to the  ?                          patient. ?                          - Resume previous diet. ?                          - Continue present medications. ?                          - Await pathology results. ?                          -  Recommend repeat enteroscopy with ablation of  ?                          duodenal angioectasia and EMR of duodenal polyp.  ?                          This will need to be done in the hospital setting. ?                          - Recommend colonoscopy as well to exclude lower GI  ?                          source of iron deficiency anemia such as malignancy  ?                           and colonic AVMs. ?                          - Continue oral iron replacement ?Haren E. Candis Schatz, MD ?08/29/2021 10:15:26 AM ?This report has been signed electronically. ?

## 2021-08-29 NOTE — Progress Notes (Signed)
Sedate, gd SR, tolerated procedure well, VSS, report to RN 

## 2021-08-29 NOTE — Progress Notes (Signed)
[facility] History and Physical ? ? ?Primary Care Physician:  Virgie Dad, MD ? ? ?Reason for Procedure:   Iron deficiency anemia ? ?Plan:    EGD ? ? ? ? ?HPI: Bryan Stafford is a 76 y.o. male undergoing EGD to evaluate iron deficiency anemia.  He was recommended to undergo a colonoscopy as well, but he declined.  Hemoglobin increased to 10.3 from 8.9 in Nov 2022 with iron supplementation.  Iron panel Nov with ferritin of 4, sat 3% and serum iron 14.  He denies any GI symptoms other than transient changes in his stool consistency.  No unintentional weight loss. ? ? ?Past Medical History:  ?Diagnosis Date  ? Allergy   ? Anemia   ? Anxiety   ? Depression   ? Diabetes mellitus without complication ([place])   ? GERD (gastroesophageal reflux disease)   ? Hyperlipidemia   ? Hypertension   ? Sleep apnea   ? ? ?Past Surgical History:  ?Procedure Laterality Date  ? APPENDECTOMY  1952  ? LUMBAR DISC SURGERY    ? ? ?Prior to Admission medications   ?Medication Sig Start Date End Date Taking? Authorizing Provider  ?Cyanocobalamin (VITAMIN B 12 PO) Take 1,000 mcg by mouth.   Yes [provider]  ?ferrous sulfate 325 (65 FE) MG EC tablet Take 325 mg by mouth daily.   Yes [provider]  ?metFORMIN (GLUCOPHAGE) 1000 MG tablet Take 1,000 mg by mouth 2 (two) times daily with a meal.   Yes [provider]  ?pantoprazole (PROTONIX) 40 MG tablet Take 40 mg by mouth daily.   Yes [provider]  ?ramipril (ALTACE) 5 MG capsule Take 5 mg by mouth daily.   Yes [provider]  ?rosuvastatin (CRESTOR) 10 MG tablet Take 1 tablet (10 mg total) by mouth daily. 04/20/21  Yes Virgie Dad, MD  ?sertraline (ZOLOFT) 50 MG tablet Take 50 mg by mouth daily.   Yes [provider]  ?tamsulosin (FLOMAX) 0.4 MG CAPS capsule Take 0.4 mg by mouth at bedtime.   Yes [provider]  ?meclizine (ANTIVERT) 12.5 MG tablet Take 1 tablet (12.5 mg total) by mouth 3 (three) times daily  as needed for dizziness. 05/17/21   Virgie Dad, MD  ? ? ?Current Outpatient Medications  ?Medication Sig Dispense Refill  ? Cyanocobalamin (VITAMIN B 12 PO) Take 1,000 mcg by mouth.    ? ferrous sulfate 325 (65 FE) MG EC tablet Take 325 mg by mouth daily.    ? metFORMIN (GLUCOPHAGE) 1000 MG tablet Take 1,000 mg by mouth 2 (two) times daily with a meal.    ? pantoprazole (PROTONIX) 40 MG tablet Take 40 mg by mouth daily.    ? ramipril (ALTACE) 5 MG capsule Take 5 mg by mouth daily.    ? rosuvastatin (CRESTOR) 10 MG tablet Take 1 tablet (10 mg total) by mouth daily. 30 tablet 3  ? sertraline (ZOLOFT) 50 MG tablet Take 50 mg by mouth daily.    ? tamsulosin (FLOMAX) 0.4 MG CAPS capsule Take 0.4 mg by mouth at bedtime.    ? meclizine (ANTIVERT) 12.5 MG tablet Take 1 tablet (12.5 mg total) by mouth 3 (three) times daily as needed for dizziness. 30 tablet 0  ? ?Current Facility-Administered Medications  ?Medication Dose Route Frequency Provider Last Rate Last Admin  ? 0.9 %  sodium chloride infusion  500 mL Intravenous Once Daryel November, MD      ? ? ?Allergies  as of 08/29/2021  ? (No Known Allergies)  ? ? ?Family History  ?Problem Relation Age of Onset  ? Heart failure Mother   ? Hypertension Mother   ? Hyperlipidemia Mother   ? Heart disease Father   ? Hypertension Father   ? Hyperlipidemia Father   ? Sleep apnea Sister   ? COPD Sister   ? Colon cancer Neg Hx   ? Esophageal cancer Neg Hx   ? Stomach cancer Neg Hx   ? Rectal cancer Neg Hx   ? ? ?Social History  ? ?Socioeconomic History  ? Marital status: Married  ?  Spouse name: Not on file  ? Number of children: 2  ? Years of education: Not on file  ? Highest education level: Master's degree (e.g., MA, MS, MEng, MEd, MSW, MBA)  ?Occupational History  ? Occupation: Controller  ?  Comment: Controller of [employer]  ? Occupation: retired  ?Tobacco Use  ? Smoking status: Never  ?  Passive exposure: Never  ? Smokeless tobacco: Never  ?Vaping Use  ? Vaping  Use: Never used  ?Substance and Sexual Activity  ? Alcohol use: Yes  ?  Alcohol/week: 1.0 standard drink  ?  Types: 1 Standard drinks or equivalent per week  ?  Comment: wine occasional  ? Drug use: Never  ? Sexual activity: Not on file  ?Other Topics Concern  ? Not on file  ?Social History Narrative  ? Do you drink/eat things with caffeine:Yes Coffee and Tea  ? What year were you married? 1973  ? Do you live in a house, apartment, assisted living, condo, trailer, etc.? Duplex  ? Is it one or more stories? One  ? How many persons live in your home? 2- Wife and myself   ? Do you exercise? No                              ? Do you have a DNR form? No If not, do you want to discuss one? Yes  ? Do you have any difficulty bathing or dressing yourself? No  ? Do you have difficulty preparing food or eating?No  ? Do you have difficulty managing your medications?No  ? Do you have any difficulty managing your finances? No  ? Do you have any difficulty affording your medications? No  ?   ?    ? ?Social Determinants of Health  ? ?Financial Resource Strain: Not on file  ?Food Insecurity: Not on file  ?Transportation Needs: Not on file  ?Physical Activity: Not on file  ?Stress: Not on file  ?Social Connections: Not on file  ?Intimate Partner Violence: Not on file  ? ? ?Review of Systems: ? ?All other review of systems negative except as mentioned in the HPI. ? ?Physical Exam: ?Vital signs ?BP 140/73   Pulse 70   Temp (!) 97.5 ?F (36.4 ?C)   Ht '5\' 11"'$  (1.803 m)   Wt 194 lb (88 kg)   SpO2 97%   BMI 27.06 kg/m?  ? ?General:   Alert,  Well-developed, well-nourished, pleasant and cooperative in NAD ?Airway:  Mallampati 2 ?Lungs:  Clear throughout to auscultation.   ?Heart:  Regular rate and rhythm; no murmurs, clicks, rubs,  or gallops. ?Abdomen:  Soft, nontender and nondistended. Normal bowel sounds.   ?Neuro/Psych:  Normal mood and affect. A and O x 3 ? ? ?Celani E. Candis Schatz, MD ?[facility] ? ?

## 2021-08-29 NOTE — Progress Notes (Signed)
Called to room to assist during endoscopic procedure.  Patient ID and intended procedure confirmed with present staff. Received instructions for my participation in the procedure from the performing physician.  

## 2021-08-29 NOTE — Progress Notes (Signed)
VS by CW. ?

## 2021-08-29 NOTE — Patient Instructions (Addendum)
Please read handouts provided. ?Continue present medications. ?Continue oral iron replacement. ?Recommend colonoscopy. ?Await pathology results. ? ? ?YOU HAD AN ENDOSCOPIC PROCEDURE TODAY AT [facility]:   Refer to the procedure report that was given to you for any specific questions about what was found during the examination.  If the procedure report does not answer your questions, please call your gastroenterologist to clarify.  If you requested that your care partner not be given the details of your procedure findings, then the procedure report has been included in a sealed envelope for you to review at your convenience later. ? ?YOU SHOULD EXPECT: Some feelings of bloating in the abdomen. Passage of more gas than usual.  Walking can help get rid of the air that was put into your GI tract during the procedure and reduce the bloating. If you had a lower endoscopy (such as a colonoscopy or flexible sigmoidoscopy) you may notice spotting of blood in your stool or on the toilet paper. If you underwent a bowel prep for your procedure, you may not have a normal bowel movement for a few days. ? ?Please Note:  You might notice some irritation and congestion in your nose or some drainage.  This is from the oxygen used during your procedure.  There is no need for concern and it should clear up in a day or so. ? ?SYMPTOMS TO REPORT IMMEDIATELY: ? ? ? ?Following upper endoscopy (EGD) ? Vomiting of blood or coffee ground material ? New chest pain or pain under the shoulder blades ? Painful or persistently difficult swallowing ? New shortness of breath ? Fever of 100?F or higher ? Black, tarry-looking stools ? ?For urgent or emergent issues, a gastroenterologist can be reached at any hour by calling [phone]. ?Do not use MyChart messaging for urgent concerns.  ? ? ?DIET:  We do recommend a small meal at first, but then you may proceed to your regular diet.  Drink plenty of fluids but you should avoid  alcoholic beverages for 24 hours. ? ?ACTIVITY:  You should plan to take it easy for the rest of today and you should NOT DRIVE or use heavy machinery until tomorrow (because of the sedation medicines used during the test).   ? ?FOLLOW UP: ?Our staff will call the number listed on your records 48-72 hours following your procedure to check on you and address any questions or concerns that you may have regarding the information given to you following your procedure. If we do not reach you, we will leave a message.  We will attempt to reach you two times.  During this call, we will ask if you have developed any symptoms of COVID 19. If you develop any symptoms (ie: fever, flu-like symptoms, shortness of breath, cough etc.) before then, please call [phone].  If you test positive for Covid 19 in the 2 weeks post procedure, please call and report this information to [place].   ? ?If any biopsies were taken you will be contacted by phone or by letter within the next 1-3 weeks.  Please call us at [phone] if you have not heard about the biopsies in 3 weeks.  ? ? ?SIGNATURES/CONFIDENTIALITY: ?You and/or your care partner have signed paperwork which will be entered into your electronic medical record.  These signatures attest to the fact that that the information above on your After Visit Summary has been reviewed and is understood.  Full responsibility of the confidentiality of this discharge information lies  with you and/or your care-partner.  ?

## 2021-08-31 ENCOUNTER — Other Ambulatory Visit: Payer: Self-pay | Admitting: Internal Medicine

## 2021-08-31 ENCOUNTER — Telehealth: Payer: Self-pay

## 2021-08-31 NOTE — Telephone Encounter (Signed)
?  Follow up Call- ? ? ?  08/29/2021  ?  9:18 AM  ?Call back number  ?Post procedure Call Back phone  # [phone]  ?Permission to leave phone message Yes  ?  ? ?Patient questions: ? ?Do you have a fever, pain , or abdominal swelling? No. ?Pain Score  0 * ? ?Have you tolerated food without any problems? Yes.   ? ?Have you been able to return to your normal activities? Yes.   ? ?Do you have any questions about your discharge instructions: ?Diet   No. ?Medications  No. ?Follow up visit  No. ? ?Do you have questions or concerns about your Care? No. ? ?Actions: ?* If pain score is 4 or above: ?No action needed, pain <4. ? ?Have you developed a fever since your procedure? no ? ?2.   Have you had an respiratory symptoms (SOB or cough) since your procedure? no ? ?3.   Have you tested positive for COVID 19 since your procedure no ? ?4.   Have you had any family members/close contacts diagnosed with the COVID 19 since your procedure?  no ? ? ?If yes to any of these questions please route to Joylene John, RN and Joella Prince, RN  ? ? ?

## 2021-09-08 ENCOUNTER — Telehealth: Payer: Self-pay | Admitting: Gastroenterology

## 2021-09-08 NOTE — Telephone Encounter (Signed)
Dr Candis Schatz see EGD report and please advise if pt can have colon scheduled with you or if he needs appt ?

## 2021-09-08 NOTE — Telephone Encounter (Signed)
Pt came into the office wanting to schedule colon that he stated Dr. Candis Schatz recommended. Pt recently had EGD. Pls advise if it is ok to schedule pt for direct colon or ov.  ?

## 2021-09-12 NOTE — Telephone Encounter (Signed)
Pt stopped by at the office this morning inquiring about this message. He was informed that we are pending to hear from Dr. Candis Schatz and that we will call him with his advise.  ?

## 2021-09-12 NOTE — Telephone Encounter (Signed)
See notes below and advise regarding EGD. ?

## 2021-09-13 ENCOUNTER — Other Ambulatory Visit: Payer: Self-pay

## 2021-09-13 DIAGNOSIS — D509 Iron deficiency anemia, unspecified: Secondary | ICD-10-CM

## 2021-09-13 DIAGNOSIS — Z1211 Encounter for screening for malignant neoplasm of colon: Secondary | ICD-10-CM

## 2021-09-13 NOTE — Telephone Encounter (Signed)
Called patient and scheduled him for Colonoscopy in the [place]. Patient is scheduled for 10/25/21. Patient stated he is in the process of moving and would call back if he needed to reschedule.Patient is also on the Hospital list . ?

## 2021-09-14 NOTE — Telephone Encounter (Signed)
Noted  

## 2021-09-15 ENCOUNTER — Encounter: Payer: Self-pay | Admitting: Gastroenterology

## 2021-09-26 ENCOUNTER — Telehealth: Payer: Self-pay | Admitting: Gastroenterology

## 2021-09-26 NOTE — Telephone Encounter (Signed)
Hi Amy and Es, could one of you please contact this pt? He is scheduled for colon on 10/25/21 and would like to know if his insurance will cover it. I am not sure of who will be working on this case but this is the second time that he has come to the office looking for an answer. I appreciate your help with this. Thank you.  ?

## 2021-10-04 ENCOUNTER — Encounter: Payer: Self-pay | Admitting: Family Medicine

## 2021-10-04 ENCOUNTER — Ambulatory Visit (INDEPENDENT_AMBULATORY_CARE_PROVIDER_SITE_OTHER): Payer: Medicare Other | Admitting: Family Medicine

## 2021-10-04 VITALS — BP 130/80 | HR 84 | Temp 96.8°F | Ht 71.0 in | Wt 195.6 lb

## 2021-10-04 DIAGNOSIS — D509 Iron deficiency anemia, unspecified: Secondary | ICD-10-CM | POA: Diagnosis not present

## 2021-10-04 DIAGNOSIS — D649 Anemia, unspecified: Secondary | ICD-10-CM | POA: Insufficient documentation

## 2021-10-04 DIAGNOSIS — F32A Depression, unspecified: Secondary | ICD-10-CM

## 2021-10-04 DIAGNOSIS — K219 Gastro-esophageal reflux disease without esophagitis: Secondary | ICD-10-CM

## 2021-10-04 DIAGNOSIS — E785 Hyperlipidemia, unspecified: Secondary | ICD-10-CM | POA: Diagnosis not present

## 2021-10-04 DIAGNOSIS — F39 Unspecified mood [affective] disorder: Secondary | ICD-10-CM | POA: Diagnosis not present

## 2021-10-04 MED ORDER — PANTOPRAZOLE SODIUM 40 MG PO TBEC: 40.0000 mg | DELAYED_RELEASE_TABLET | Freq: Every day | ORAL | 3 refills | Status: DC

## 2021-10-04 MED ORDER — ROSUVASTATIN CALCIUM 10 MG PO TABS: 10.0000 mg | ORAL_TABLET | Freq: Every day | ORAL | 2 refills | Status: DC

## 2021-10-04 MED ORDER — SERTRALINE HCL 50 MG PO TABS: 50.0000 mg | ORAL_TABLET | Freq: Every day | ORAL | 3 refills | Status: DC

## 2021-10-04 MED ORDER — TAMSULOSIN HCL 0.4 MG PO CAPS: 0.4000 mg | ORAL_CAPSULE | Freq: Every evening | ORAL | 2 refills | Status: DC

## 2021-10-04 NOTE — Patient Instructions (Signed)
Increase sertraline to 100 mg/day ?
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
## 2021-10-09 ENCOUNTER — Other Ambulatory Visit: Payer: Self-pay

## 2021-10-09 ENCOUNTER — Telehealth: Payer: Self-pay

## 2021-10-09 DIAGNOSIS — K317 Polyp of stomach and duodenum: Secondary | ICD-10-CM

## 2021-10-09 NOTE — Telephone Encounter (Signed)
Called patient to see if he be available to have his Enteroscopy at [facility] on 10/12/21. Patient decided he did not want to do that day. He wanted a day after 10/25/21. ?

## 2021-10-12 ENCOUNTER — Ambulatory Visit (HOSPITAL_COMMUNITY): Admit: 2021-10-12 | Payer: Medicare Other | Admitting: Gastroenterology

## 2021-10-12 ENCOUNTER — Encounter (HOSPITAL_COMMUNITY): Payer: Self-pay

## 2021-10-12 SURGERY — ENTEROSCOPY
Anesthesia: Monitor Anesthesia Care

## 2021-10-19 ENCOUNTER — Encounter: Payer: Self-pay | Admitting: Gastroenterology

## 2021-10-25 ENCOUNTER — Ambulatory Visit (AMBULATORY_SURGERY_CENTER): Payer: Medicare Other | Admitting: Gastroenterology

## 2021-10-25 ENCOUNTER — Encounter: Payer: Self-pay | Admitting: Gastroenterology

## 2021-10-25 VITALS — BP 121/69 | HR 56 | Temp 97.5°F | Resp 14 | Ht 71.0 in | Wt 194.0 lb

## 2021-10-25 DIAGNOSIS — D122 Benign neoplasm of ascending colon: Secondary | ICD-10-CM

## 2021-10-25 DIAGNOSIS — K640 First degree hemorrhoids: Secondary | ICD-10-CM | POA: Diagnosis not present

## 2021-10-25 DIAGNOSIS — D500 Iron deficiency anemia secondary to blood loss (chronic): Secondary | ICD-10-CM

## 2021-10-25 DIAGNOSIS — Z1211 Encounter for screening for malignant neoplasm of colon: Secondary | ICD-10-CM

## 2021-10-25 MED ORDER — SODIUM CHLORIDE 0.9 % IV SOLN: 500.0000 mL | INTRAVENOUS | Status: DC

## 2021-10-25 NOTE — Patient Instructions (Signed)
Handout on polyps given. ° °YOU HAD AN ENDOSCOPIC PROCEDURE TODAY AT THE [facility]:   Refer to the procedure report that was given to you for any specific questions about what was found during the examination.  If the procedure report does not answer your questions, please call your gastroenterologist to clarify.  If you requested that your care partner not be given the details of your procedure findings, then the procedure report has been included in a sealed envelope for you to review at your convenience later. ° °YOU SHOULD EXPECT: Some feelings of bloating in the abdomen. Passage of more gas than usual.  Walking can help get rid of the air that was put into your GI tract during the procedure and reduce the bloating. If you had a lower endoscopy (such as a colonoscopy or flexible sigmoidoscopy) you may notice spotting of blood in your stool or on the toilet paper. If you underwent a bowel prep for your procedure, you may not have a normal bowel movement for a few days. ° °Please Note:  You might notice some irritation and congestion in your nose or some drainage.  This is from the oxygen used during your procedure.  There is no need for concern and it should clear up in a day or so. ° °SYMPTOMS TO REPORT IMMEDIATELY: ° °Following lower endoscopy (colonoscopy or flexible sigmoidoscopy): ° Excessive amounts of blood in the stool ° Significant tenderness or worsening of abdominal pains ° Swelling of the abdomen that is new, acute ° Fever of 100°F or higher ° °For urgent or emergent issues, a gastroenterologist can be reached at any hour by calling [phone]. °Do not use MyChart messaging for urgent concerns.  ° ° °DIET:  We do recommend a small meal at first, but then you may proceed to your regular diet.  Drink plenty of fluids but you should avoid alcoholic beverages for 24 hours. ° °ACTIVITY:  You should plan to take it easy for the rest of today and you should NOT DRIVE or use heavy machinery  until tomorrow (because of the sedation medicines used during the test).   ° °FOLLOW UP: °Our staff will call the number listed on your records 48-72 hours following your procedure to check on you and address any questions or concerns that you may have regarding the information given to you following your procedure. If we do not reach you, we will leave a message.  We will attempt to reach you two times.  During this call, we will ask if you have developed any symptoms of COVID 19. If you develop any symptoms (ie: fever, flu-like symptoms, shortness of breath, cough etc.) before then, please call [phone].  If you test positive for Covid 19 in the 2 weeks post procedure, please call and report this information to us.   ° °If any biopsies were taken you will be contacted by phone or by letter within the next 1-3 weeks.  Please call us at [phone] if you have not heard about the biopsies in 3 weeks.  ° ° °SIGNATURES/CONFIDENTIALITY: °You and/or your care partner have signed paperwork which will be entered into your electronic medical record.  These signatures attest to the fact that that the information above on your After Visit Summary has been reviewed and is understood.  Full responsibility of the confidentiality of this discharge information lies with you and/or your care-partner.  °

## 2021-10-25 NOTE — Op Note (Signed)
[place] ?Patient Name: Bryan Stafford ?Procedure Date: 10/25/2021 8:24 AM ?MRN: [id number] ?Endoscopist: Furia E. Candis Schatz , MD ?Age: 76 ?Referring MD:  ?Date of Birth: 06/15/1945 ?Gender: Male ?Account #: [id number] ?Procedure:                Colonoscopy ?Indications:              Iron deficiency anemia ?Medicines:                Monitored Anesthesia Care ?Procedure:                Pre-Anesthesia Assessment: ?                          - Prior to the procedure, a History and Physical  ?                          was performed, and patient medications and  ?                          allergies were reviewed. The patient's tolerance of  ?                          previous anesthesia was also reviewed. The risks  ?                          and benefits of the procedure and the sedation  ?                          options and risks were discussed with the patient.  ?                          All questions were answered, and informed consent  ?                          was obtained. Prior Anticoagulants: The patient has  ?                          taken no previous anticoagulant or antiplatelet  ?                          agents except for aspirin. ASA Grade Assessment:  ?                          III - A patient with severe systemic disease. After  ?                          reviewing the risks and benefits, the patient was  ?                          deemed in satisfactory condition to undergo the  ?                          procedure. ?  After obtaining informed consent, the colonoscope  ?                          was passed under direct vision. Throughout the  ?                          procedure, the patient's blood pressure, pulse, and  ?                          oxygen saturations were monitored continuously. The  ?                          Olympus CF-HQ190L (Serial# [id number]) Colonoscope was  ?                          introduced through the anus and advanced to the the  ?                           terminal ileum, with identification of the  ?                          appendiceal orifice and IC valve. The colonoscopy  ?                          was performed without difficulty. The patient  ?                          tolerated the procedure well. The quality of the  ?                          bowel preparation was adequate. The terminal ileum,  ?                          ileocecal valve, appendiceal orifice, and rectum  ?                          were photographed. ?Scope In: 10:02:39 AM ?Scope Out: 10:23:44 AM ?Scope Withdrawal Time: 0 hours 15 minutes 17 seconds  ?Total Procedure Duration: 0 hours 21 minutes 5 seconds  ?Findings:                 The perianal and digital rectal examinations were  ?                          normal. Pertinent negatives include normal  ?                          sphincter tone and no palpable rectal lesions. ?                          A 8 mm polyp was found in the ascending colon. The  ?                          polyp was sessile. The polyp was removed with a  ?  cold snare. Resection and retrieval were complete.  ?                          Estimated blood loss was minimal. ?                          The exam was otherwise normal throughout the  ?                          examined colon. ?                          The terminal ileum appeared normal. ?                          Non-bleeding internal hemorrhoids were found during  ?                          retroflexion. The hemorrhoids were Grade I  ?                          (internal hemorrhoids that do not prolapse). ?                          No additional abnormalities were found on  ?                          retroflexion. ?Complications:            No immediate complications. ?Estimated Blood Loss:     Estimated blood loss was minimal. ?Impression:               - One 8 mm polyp in the ascending colon, removed  ?                          with a cold snare. Resected and retrieved. ?                           - The examined portion of the ileum was normal. ?                          - Non-bleeding internal hemorrhoids. ?                          - No findings to explain iron deficiency anemia.  ?                          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                          on EGD. ?Recommendation:           - Patient has a contact number available for  ?                          emergencies. The signs and symptoms of potential  ?  delayed complications were discussed with the  ?                          patient. Return to normal activities tomorrow.  ?                          Written discharge instructions were provided to the  ?                          patient. ?                          - Resume previous diet. ?                          - Continue present medications. ?                          - Await pathology results. ?                          - Proceed with enteroscopy in hospital to remove  ?                          duodenal polyp and ablate intestinal AVMs ?[place] E. Candis Schatz, MD ?10/25/2021 10:31:20 AM ?This report has been signed electronically. ?

## 2021-10-25 NOTE — Progress Notes (Signed)
Pt non-responsive, VVS, Report to RN  °

## 2021-10-25 NOTE — Progress Notes (Signed)
Called to room to assist during endoscopic procedure.  Patient ID and intended procedure confirmed with present staff. Received instructions for my participation in the procedure from the performing physician.  

## 2021-10-25 NOTE — Progress Notes (Signed)
[facility] History and Physical ? ? ?Primary Care Physician:  Virgie Dad, MD ? ? ?Reason for Procedure:   Iron deficiency anemia ? ?Plan:    Colonoscopy ? ? ? ? ?HPI: Bryan Stafford is a 76 y.o. male undergoing colonoscopy due to iron deficiency anemia.  He has no family history of colon cancer.  He has bloating and constipation but no history of overt GI bleeding.  ? ? ?Past Medical History:  ?Diagnosis Date  ? Allergy   ? Anemia   ? Anxiety   ? Depression   ? Diabetes mellitus without complication ([place])   ? GERD (gastroesophageal reflux disease)   ? Hyperlipidemia   ? Hypertension   ? Sleep apnea   ? ? ?Past Surgical History:  ?Procedure Laterality Date  ? APPENDECTOMY  1952  ? LUMBAR DISC SURGERY    ? ? ?Prior to Admission medications   ?Medication Sig Start Date End Date Taking? Authorizing Provider  ?Cyanocobalamin (VITAMIN B 12 PO) Take 1,000 mcg by mouth.   Yes [provider]  ?ferrous sulfate 325 (65 FE) MG EC tablet Take 325 mg by mouth daily.   Yes [provider]  ?meclizine (ANTIVERT) 12.5 MG tablet Take 1 tablet (12.5 mg total) by mouth 3 (three) times daily as needed for dizziness. 05/17/21  Yes Virgie Dad, MD  ?metFORMIN (GLUCOPHAGE) 1000 MG tablet TAKE 1 TABLET BY MOUTH TWICE DAILY 08/31/21  Yes Virgie Dad, MD  ?pantoprazole (PROTONIX) 40 MG tablet Take 1 tablet (40 mg total) by mouth daily. 10/04/21  Yes Wardell Honour, MD  ?ramipril (ALTACE) 5 MG capsule Take 5 mg by mouth daily.   Yes [provider]  ?rosuvastatin (CRESTOR) 10 MG tablet Take 1 tablet (10 mg total) by mouth daily. 10/04/21  Yes Wardell Honour, MD  ?sertraline (ZOLOFT) 50 MG tablet Take 1 tablet (50 mg total) by mouth daily. 10/04/21  Yes Wardell Honour, MD  ?tamsulosin (FLOMAX) 0.4 MG CAPS capsule Take 1 capsule (0.4 mg total) by mouth at bedtime. 10/04/21  Yes Wardell Honour, MD  ? ? ?Current Outpatient Medications  ?Medication Sig Dispense Refill  ? Cyanocobalamin  (VITAMIN B 12 PO) Take 1,000 mcg by mouth.    ? ferrous sulfate 325 (65 FE) MG EC tablet Take 325 mg by mouth daily.    ? meclizine (ANTIVERT) 12.5 MG tablet Take 1 tablet (12.5 mg total) by mouth 3 (three) times daily as needed for dizziness. 30 tablet 0  ? metFORMIN (GLUCOPHAGE) 1000 MG tablet TAKE 1 TABLET BY MOUTH TWICE DAILY 180 tablet 1  ? pantoprazole (PROTONIX) 40 MG tablet Take 1 tablet (40 mg total) by mouth daily. 90 tablet 3  ? ramipril (ALTACE) 5 MG capsule Take 5 mg by mouth daily.    ? rosuvastatin (CRESTOR) 10 MG tablet Take 1 tablet (10 mg total) by mouth daily. 90 tablet 2  ? sertraline (ZOLOFT) 50 MG tablet Take 1 tablet (50 mg total) by mouth daily. 30 tablet 3  ? tamsulosin (FLOMAX) 0.4 MG CAPS capsule Take 1 capsule (0.4 mg total) by mouth at bedtime. 90 capsule 2  ? ?Current Facility-Administered Medications  ?Medication Dose Route Frequency Provider Last Rate Last Admin  ? 0.9 %  sodium chloride infusion  500 mL Intravenous Continuous Daryel November, MD      ? ? ?Allergies as of 10/25/2021  ? (No Known Allergies)  ? ? ?Family History  ?Problem Relation Age of Onset  ?  Heart failure Mother   ? Hypertension Mother   ? Hyperlipidemia Mother   ? Heart disease Father   ? Hypertension Father   ? Hyperlipidemia Father   ? Sleep apnea Sister   ? COPD Sister   ? Colon cancer Neg Hx   ? Esophageal cancer Neg Hx   ? Stomach cancer Neg Hx   ? Rectal cancer Neg Hx   ? ? ?Social History  ? ?Socioeconomic History  ? Marital status: Married  ?  Spouse name: Not on file  ? Number of children: 2  ? Years of education: Not on file  ? Highest education level: Master's degree (e.g., MA, MS, MEng, MEd, MSW, MBA)  ?Occupational History  ? Occupation: Controller  ?  Comment: Controller of [employer]  ? Occupation: retired  ?Tobacco Use  ? Smoking status: Never  ?  Passive exposure: Never  ? Smokeless tobacco: Never  ?Vaping Use  ? Vaping Use: Never used  ?Substance and Sexual Activity  ? Alcohol use: Yes   ?  Alcohol/week: 1.0 standard drink  ?  Types: 1 Standard drinks or equivalent per week  ?  Comment: wine occasional  ? Drug use: Never  ? Sexual activity: Not on file  ?Other Topics Concern  ? Not on file  ?Social History Narrative  ? Do you drink/eat things with caffeine:Yes Coffee and Tea  ? What year were you married? 1973  ? Do you live in a house, apartment, assisted living, condo, trailer, etc.? Duplex  ? Is it one or more stories? One  ? How many persons live in your home? 2- Wife and myself   ? Do you exercise? No                              ? Do you have a DNR form? No If not, do you want to discuss one? Yes  ? Do you have any difficulty bathing or dressing yourself? No  ? Do you have difficulty preparing food or eating?No  ? Do you have difficulty managing your medications?No  ? Do you have any difficulty managing your finances? No  ? Do you have any difficulty affording your medications? No  ?   ?    ? ?Social Determinants of Health  ? ?Financial Resource Strain: Not on file  ?Food Insecurity: Not on file  ?Transportation Needs: Not on file  ?Physical Activity: Not on file  ?Stress: Not on file  ?Social Connections: Not on file  ?Intimate Partner Violence: Not on file  ? ? ?Review of Systems: ? ?All other review of systems negative except as mentioned in the HPI. ? ?Physical Exam: ?Vital signs ?BP 110/64   Pulse 78   Temp (!) 97.5 ?F (36.4 ?C) (Temporal)   Ht '5\' 11"'$  (1.803 m)   Wt 194 lb (88 kg)   SpO2 95%   BMI 27.06 kg/m?  ? ?General:   Alert,  Well-developed, well-nourished, pleasant and cooperative in NAD ?Airway:  Mallampati 2 ?Lungs:  Clear throughout to auscultation.   ?Heart:  Regular rate and rhythm; no murmurs, clicks, rubs,  or gallops. ?Abdomen:  Soft, nontender and nondistended. Normal bowel sounds.   ?Neuro/Psych:  Normal mood and affect. A and O x 3 ? ? ?Minerd E. Candis Schatz, MD ?[facility] ? ?

## 2021-10-26 ENCOUNTER — Telehealth: Payer: Self-pay | Admitting: *Deleted

## 2021-10-26 ENCOUNTER — Other Ambulatory Visit: Payer: Self-pay

## 2021-10-26 ENCOUNTER — Telehealth: Payer: Self-pay

## 2021-10-26 ENCOUNTER — Other Ambulatory Visit: Payer: Self-pay | Admitting: Internal Medicine

## 2021-10-26 DIAGNOSIS — E785 Hyperlipidemia, unspecified: Secondary | ICD-10-CM

## 2021-10-26 DIAGNOSIS — K317 Polyp of stomach and duodenum: Secondary | ICD-10-CM

## 2021-10-26 NOTE — Telephone Encounter (Signed)
  Follow up Call-     10/25/2021    8:19 AM 08/29/2021    9:18 AM  Call back number  Post procedure Call Back phone  # [phone]  Permission to leave phone message Yes Yes     Patient questions:  Do you have a fever, pain , or abdominal swelling? No. Pain Score  0 *  Have you tolerated food without any problems? Yes.    Have you been able to return to your normal activities? Yes.    Do you have any questions about your discharge instructions: Diet   No. Medications  No. Follow up visit  No.  Do you have questions or concerns about your Care? No.  Actions: * If pain score is 4 or above: No action needed, pain <4.

## 2021-10-26 NOTE — Telephone Encounter (Signed)
  Follow up Call-     10/25/2021    8:19 AM 08/29/2021    9:18 AM  Call back number  Post procedure Call Back phone  # [phone]  Permission to leave phone message Yes Yes     Left message

## 2021-10-26 NOTE — Addendum Note (Signed)
Addended by: Darrall Dears on: 10/26/2021 01:51 PM   Modules accepted: Orders

## 2021-10-26 NOTE — Telephone Encounter (Signed)
Called and spoke with patient and scheduled Enteroscopy for 11/16/21 @ 10:15 am. Instructions have been mailed to patient. Patient had no questions at the end of call.

## 2021-10-31 NOTE — Progress Notes (Signed)
Mr. Bryan Stafford, The polyp removed from your colon was completely benign, but is considered a precancerous polyp, meaning that it would have had the potential to turn into cancer had it not been completely removed.  Because of the specific type of polyp you had removed (traditional serrated adenoma), our guidelines recommend repeating a colonoscopy in 3 years.  Given your advanced age, the risks of repeat colonoscopy may outweigh the benefits.  I would suggest that we have a conversation in 3 years to discuss whether further surveillance would be beneficial for you. It is still highly recommended that you proceed with the removal of the duodenal polyp and ablation of the intestinal AVM in the hospital as scheduled.

## 2021-11-01 ENCOUNTER — Ambulatory Visit: Payer: Medicare Other | Admitting: Family Medicine

## 2021-11-01 ENCOUNTER — Encounter: Payer: Self-pay | Admitting: Family Medicine

## 2021-11-01 VITALS — BP 126/70 | HR 67 | Temp 96.6°F | Ht 71.0 in | Wt 189.0 lb

## 2021-11-01 DIAGNOSIS — H9193 Unspecified hearing loss, bilateral: Secondary | ICD-10-CM

## 2021-11-01 DIAGNOSIS — D509 Iron deficiency anemia, unspecified: Secondary | ICD-10-CM | POA: Diagnosis not present

## 2021-11-01 DIAGNOSIS — E118 Type 2 diabetes mellitus with unspecified complications: Secondary | ICD-10-CM

## 2021-11-01 DIAGNOSIS — F32A Depression, unspecified: Secondary | ICD-10-CM

## 2021-11-01 NOTE — Progress Notes (Signed)
Provider:  Alain Honey, MD  Careteam: Patient Care Team: Virgie Dad, MD as PCP - General (Internal Medicine)  PLACE OF SERVICE:  [place] Directive information    No Known Allergies  Chief Complaint  Patient presents with   Medical Management of Chronic Issues    Patient presents today for a 1 month follow-up.   Quality Metric Gaps    Hep C screening and zoster     HPI: Patient is a 76 y.o. male .  Patient is here in follow-up.  He was seen a month ago with history of depression and anemia and was suggested that he increase sertraline from 50 to 100 mg.  He did not do that. Since his last visit he has had endoscopy evaluation.  Not sure of the findings but suspicious for polyp as a cause of his anemia.  He is taking iron supplements 325 mg twice daily. At his first visit we assessed his lipids and LDL was at goal at 61.  Renal function is mildly elevated.  A1c is at goal at 6.5.  This was 6 months ago when he lived at friend's home. He is scheduled to see psychiatry next month.  Review of Systems:  Review of Systems  Constitutional: Negative.   HENT: Negative.    Respiratory: Negative.    Cardiovascular: Negative.   Neurological: Negative.   Psychiatric/Behavioral: Negative.    All other systems reviewed and are negative.  Past Medical History:  Diagnosis Date   Allergy    Anemia    Anxiety    Depression    Diabetes mellitus without complication (HCC)    GERD (gastroesophageal reflux disease)    Hyperlipidemia    Hypertension    Sleep apnea    Past Surgical History:  Procedure Laterality Date   [place]     Social History:   reports that he has never smoked. He has never been exposed to tobacco smoke. He has never used smokeless tobacco. He reports current alcohol use of about 1.0 standard drink per week. He reports that he does not use drugs.  Family History  Problem Relation Age of Onset   Heart  failure Mother    Hypertension Mother    Hyperlipidemia Mother    Heart disease Father    Hypertension Father    Hyperlipidemia Father    Sleep apnea Sister    COPD Sister    Colon cancer Neg Hx    Esophageal cancer Neg Hx    Stomach cancer Neg Hx    Rectal cancer Neg Hx     Medications: Patient's Medications  New Prescriptions   No medications on file  Previous Medications   CYANOCOBALAMIN (VITAMIN B 12 PO)    Take 1,000 mcg by mouth.   FERROUS SULFATE 325 (65 FE) MG EC TABLET    Take 325 mg by mouth daily.   MECLIZINE (ANTIVERT) 12.5 MG TABLET    Take 1 tablet (12.5 mg total) by mouth 3 (three) times daily as needed for dizziness.   METFORMIN (GLUCOPHAGE) 1000 MG TABLET    TAKE 1 TABLET BY MOUTH TWICE DAILY   PANTOPRAZOLE (PROTONIX) 40 MG TABLET    Take 1 tablet (40 mg total) by mouth daily.   RAMIPRIL (ALTACE) 5 MG CAPSULE    Take 5 mg by mouth daily.   ROSUVASTATIN (CRESTOR) 10 MG TABLET    Take 1 tablet (10 mg total) by mouth daily.  SERTRALINE (ZOLOFT) 50 MG TABLET    Take 1 tablet (50 mg total) by mouth daily.   TAMSULOSIN (FLOMAX) 0.4 MG CAPS CAPSULE    Take 1 capsule (0.4 mg total) by mouth at bedtime.  Modified Medications   No medications on file  Discontinued Medications   No medications on file    Physical Exam:  Vitals:   11/01/21 0934  BP: 126/70  Pulse: 67  Temp: (!) 96.6 F (35.9 C)  SpO2: 97%  Weight: 189 lb (85.7 kg)  Height: '5\' 11"'$  (1.803 m)   Body mass index is 26.36 kg/m. Wt Readings from Last 3 Encounters:  11/01/21 189 lb (85.7 kg)  10/25/21 194 lb (88 kg)  10/04/21 195 lb 9.6 oz (88.7 kg)    Physical Exam Vitals and nursing note reviewed.  Constitutional:      Appearance: Normal appearance.  Cardiovascular:     Rate and Rhythm: Normal rate and regular rhythm.  Pulmonary:     Effort: Pulmonary effort is normal.     Breath sounds: Normal breath sounds.  Neurological:     General: No focal deficit present.     Mental Status: He  is alert and oriented to person, place, and time.  Psychiatric:        Mood and Affect: Mood normal.        Behavior: Behavior normal.    Labs reviewed: Basic Metabolic Panel: Recent Labs    11/18/20 0000 04/26/21 0820 07/17/21 0745  NA 138 137 138  K 4.7 5.3 5.2  CL 102 103 102  CO2 24* 24 27  GLUCOSE  --  112* 110*  BUN '12 11 14  '$ CREATININE 1.2 1.14 1.24  CALCIUM 10.3 10.6* 10.3  TSH 1.17  --   --    Liver Function Tests: Recent Labs    11/18/20 0000 04/26/21 0820 07/17/21 0745  AST 13* 13 15  ALT 7* 8* 9  ALKPHOS 57  --   --   BILITOT  --  0.3 0.4  PROT  --  7.0 6.9  ALBUMIN 4.4  --   --    No results for input(s): LIPASE, AMYLASE in the last 8760 hours. No results for input(s): AMMONIA in the last 8760 hours. CBC: Recent Labs    11/18/20 0000 04/26/21 0820 07/17/21 0745  WBC 8.7 8.7 8.6  NEUTROABS 5.14 5,342 5,151  HGB 9.8* 8.9* 10.3*  HCT 31* 31.6* 36.1*  MCV  --  66.5* 71.1*  PLT 404* 416* 359   Lipid Panel: Recent Labs    04/26/21 0820  CHOL 138  HDL 56  LDLCALC 61  TRIG 119  CHOLHDL 2.5   TSH: Recent Labs    11/18/20 0000  TSH 1.17   A1C: Lab Results  Component Value Date   HGBA1C 6.5 (H) 04/26/2021     Assessment/Plan  1. Bilateral hearing loss, unspecified hearing loss type Patient requests a hearing evaluation.  They have heard about being tested at [facility] and will try to make that happen  2. Controlled type 2 diabetes mellitus with complication, without long-term current use of insulin (HCC) We will repeat A1c today 6 months ago level was 6.5.  He continues with metformin.  Also takes ramipril for renal protection  3. Iron deficiency anemia, unspecified iron deficiency anemia type Most recent hemoglobin was 10 up from 8+.  We will also check iron and iron binding today.  He continues to take iron supplements 325 mg twice daily  4. Depression,  unspecified depression type Unsure if this is diagnosis.  There has been some  personality change in his ability to interact and get along well with people from different walks of life.  He will see psychiatry next month.  We will continue with low-dose sertraline as a mood stabilizer in the meantime   Alain Honey, MD [place] [phone]

## 2021-11-02 LAB — IRON,TIBC AND FERRITIN PANEL
%SAT: 8 % (calc) — ABNORMAL LOW (ref 20–48)
Ferritin: 7 ng/mL — ABNORMAL LOW (ref 24–380)
Iron: 30 ug/dL — ABNORMAL LOW (ref 50–180)
TIBC: 368 mcg/dL (calc) (ref 250–425)

## 2021-11-02 LAB — HEMOGLOBIN A1C
Hgb A1c MFr Bld: 7 % of total Hgb — ABNORMAL HIGH (ref <5.7)
Mean Plasma Glucose: 154 mg/dL
eAG (mmol/L): 8.5 mmol/L

## 2021-11-09 ENCOUNTER — Encounter (HOSPITAL_COMMUNITY): Payer: Self-pay | Admitting: Gastroenterology

## 2021-11-15 ENCOUNTER — Encounter: Payer: Medicare Other | Admitting: Internal Medicine

## 2021-11-16 ENCOUNTER — Other Ambulatory Visit: Payer: Self-pay

## 2021-11-16 ENCOUNTER — Ambulatory Visit (HOSPITAL_COMMUNITY): Payer: Medicare Other | Admitting: Certified Registered"

## 2021-11-16 ENCOUNTER — Ambulatory Visit (HOSPITAL_COMMUNITY)
Admission: RE | Admit: 2021-11-16 | Discharge: 2021-11-16 | Disposition: A | Discharge status: Home / Self Care | Payer: Medicare Other | Attending: Gastroenterology | Admitting: Gastroenterology

## 2021-11-16 ENCOUNTER — Encounter (HOSPITAL_COMMUNITY)
Admission: RE | Disposition: A | Discharge status: Home / Self Care | Payer: Self-pay | Source: Home / Self Care | Attending: Gastroenterology

## 2021-11-16 ENCOUNTER — Ambulatory Visit (HOSPITAL_BASED_OUTPATIENT_CLINIC_OR_DEPARTMENT_OTHER): Payer: Medicare Other | Admitting: Certified Registered"

## 2021-11-16 ENCOUNTER — Encounter (HOSPITAL_COMMUNITY): Payer: Self-pay | Admitting: Gastroenterology

## 2021-11-16 DIAGNOSIS — K219 Gastro-esophageal reflux disease without esophagitis: Secondary | ICD-10-CM | POA: Insufficient documentation

## 2021-11-16 DIAGNOSIS — K5520 Angiodysplasia of colon without hemorrhage: Secondary | ICD-10-CM | POA: Diagnosis not present

## 2021-11-16 DIAGNOSIS — D132 Benign neoplasm of duodenum: Secondary | ICD-10-CM | POA: Insufficient documentation

## 2021-11-16 DIAGNOSIS — E119 Type 2 diabetes mellitus without complications: Secondary | ICD-10-CM | POA: Insufficient documentation

## 2021-11-16 DIAGNOSIS — K317 Polyp of stomach and duodenum: Secondary | ICD-10-CM

## 2021-11-16 DIAGNOSIS — G4730 Sleep apnea, unspecified: Secondary | ICD-10-CM | POA: Diagnosis not present

## 2021-11-16 DIAGNOSIS — I10 Essential (primary) hypertension: Secondary | ICD-10-CM | POA: Insufficient documentation

## 2021-11-16 DIAGNOSIS — K31819 Angiodysplasia of stomach and duodenum without bleeding: Secondary | ICD-10-CM

## 2021-11-16 DIAGNOSIS — D509 Iron deficiency anemia, unspecified: Secondary | ICD-10-CM | POA: Diagnosis not present

## 2021-11-16 HISTORY — PX: HOT HEMOSTASIS: SHX5433

## 2021-11-16 HISTORY — PX: ENTEROSCOPY: SHX5533

## 2021-11-16 HISTORY — PX: HEMOSTASIS CLIP PLACEMENT: SHX6857

## 2021-11-16 HISTORY — PX: POLYPECTOMY: SHX5525

## 2021-11-16 HISTORY — PX: SUBMUCOSAL LIFTING INJECTION: SHX6855

## 2021-11-16 LAB — GLUCOSE, CAPILLARY: Glucose-Capillary: 118 mg/dL — ABNORMAL HIGH (ref 70–99)

## 2021-11-16 SURGERY — ENTEROSCOPY
Anesthesia: Monitor Anesthesia Care

## 2021-11-16 MED ORDER — LIDOCAINE 2% (20 MG/ML) 5 ML SYRINGE
INTRAMUSCULAR | Status: DC | PRN
  Administered 2021-11-16: 80 mg via INTRAVENOUS

## 2021-11-16 MED ORDER — PROPOFOL 10 MG/ML IV BOLUS
INTRAVENOUS | Status: DC | PRN
  Administered 2021-11-16: 10 mg via INTRAVENOUS
  Administered 2021-11-16 (×2): 20 mg via INTRAVENOUS
  Administered 2021-11-16: 50 mg via INTRAVENOUS

## 2021-11-16 MED ORDER — LACTATED RINGERS IV SOLN: INTRAVENOUS | Status: DC

## 2021-11-16 MED ORDER — SODIUM CHLORIDE 0.9 % IV SOLN: INTRAVENOUS | Status: DC

## 2021-11-16 MED ORDER — PROPOFOL 500 MG/50ML IV EMUL
INTRAVENOUS | Status: DC | PRN
  Administered 2021-11-16: 70 ug/kg/min via INTRAVENOUS

## 2021-11-16 MED ORDER — PANTOPRAZOLE SODIUM 40 MG PO TBEC: 40.0000 mg | DELAYED_RELEASE_TABLET | Freq: Two times a day (BID) | ORAL | 3 refills | Status: DC

## 2021-11-16 NOTE — Anesthesia Preprocedure Evaluation (Signed)
Anesthesia Evaluation  Patient identified by MRN, date of birth, ID band Patient awake    Reviewed: Allergy & Precautions, NPO status , Patient's Chart, lab work & pertinent test results  Airway Mallampati: II  TM Distance: >3 FB Neck ROM: Full    Dental no notable dental hx.    Pulmonary sleep apnea ,    Pulmonary exam normal breath sounds clear to auscultation       Cardiovascular hypertension, Pt. on medications Normal cardiovascular exam Rhythm:Regular Rate:Normal     Neuro/Psych negative neurological ROS  negative psych ROS   GI/Hepatic Neg liver ROS, GERD  ,  Endo/Other  diabetes, Type 2  Renal/GU negative Renal ROS  negative genitourinary   Musculoskeletal negative musculoskeletal ROS (+)   Abdominal   Peds negative pediatric ROS (+)  Hematology  (+) Blood dyscrasia, anemia ,   Anesthesia Other Findings   Reproductive/Obstetrics negative OB ROS                             Anesthesia Physical Anesthesia Plan  ASA: 3  Anesthesia Plan: MAC   Post-op Pain Management: Minimal or no pain anticipated   Induction: Intravenous  PONV Risk Score and Plan: 1 and Propofol infusion and Treatment may vary due to age or medical condition  Airway Management Planned: Simple Face Mask  Additional Equipment:   Intra-op Plan:   Post-operative Plan:   Informed Consent: I have reviewed the patients History and Physical, chart, labs and discussed the procedure including the risks, benefits and alternatives for the proposed anesthesia with the patient or authorized representative who has indicated his/her understanding and acceptance.     Dental advisory given  Plan Discussed with: CRNA and Surgeon  Anesthesia Plan Comments:         Anesthesia Quick Evaluation

## 2021-11-16 NOTE — Anesthesia Postprocedure Evaluation (Signed)
Anesthesia Post Note  Patient: Bryan Stafford  Procedure(s) Performed: ENTEROSCOPY SUBMUCOSAL LIFTING INJECTION HOT HEMOSTASIS (ARGON PLASMA COAGULATION/BICAP) POLYPECTOMY HEMOSTASIS CLIP PLACEMENT     Patient location during evaluation: PACU Anesthesia Type: MAC Level of consciousness: awake and alert Pain management: pain level controlled Vital Signs Assessment: post-procedure vital signs reviewed and stable Respiratory status: spontaneous breathing, nonlabored ventilation, respiratory function stable and patient connected to nasal cannula oxygen Cardiovascular status: stable and blood pressure returned to baseline Postop Assessment: no apparent nausea or vomiting Anesthetic complications: no   No notable events documented.  Last Vitals:  Vitals:   11/16/21 1113 11/16/21 1123  BP: 122/71 (!) 112/56  Pulse: 77 65  Resp: 17 13  Temp: (!) 36.4 C   SpO2: 100% 94%    Last Pain:  Vitals:   11/16/21 1123  TempSrc:   PainSc: 0-No pain                 [name]

## 2021-11-16 NOTE — Op Note (Signed)
Bryan Stafford Procedure Date: 11/16/2021 MRN: [id number] Attending MD: Gladstone Pih. Candis Schatz , MD Date of Birth: 1945-12-03 CSN: [id number] Age: 76 Admit Type: Outpatient Procedure:                Small bowel enteroscopy Indications:              For therapy of adenomatous polyps in the duodenum,                            For therapy of angiodysplasia (of intestine) Providers:                Nicki Reaper E. Candis Schatz, MD, Ladoris Gene, RN, Theodora Blow, Technician Referring MD:              Medicines:                Monitored Anesthesia Care Complications:            No immediate complications. Estimated Blood Loss:     Estimated blood loss was minimal. Procedure:                Pre-Anesthesia Assessment:                           - Prior to the procedure, a History and Physical                            was performed, and patient medications and                            allergies were reviewed. The patient's tolerance of                            previous anesthesia was also reviewed. The risks                            and benefits of the procedure and the sedation                            options and risks were discussed with the patient.                            All questions were answered, and informed consent                            was obtained. Prior Anticoagulants: The patient has                            taken no previous anticoagulant or antiplatelet                            agents. ASA Grade Assessment: II - A patient with  mild systemic disease. After reviewing the risks                            and benefits, the patient was deemed in                            satisfactory condition to undergo the procedure.                           After obtaining informed consent, the endoscope was                            passed under direct vision. Throughout the                             procedure, the patient's blood pressure, pulse, and                            oxygen saturations were monitored continuously. The                            [id number]-Q180 ([id number]) Olympus enteroscope was                            introduced through the mouth and advanced to the                            mid-jejunum. The GIF-H190 ([id number]) Olympus                            endoscope was introduced through the and advanced                            to the. The small bowel enteroscopy was                            accomplished without difficulty. The patient                            tolerated the procedure well. Scope In: Scope Out: Findings:      The esophagus was normal.      The stomach was normal.      A single 10 mm sessile polyp with no bleeding was found in the second       portion of the duodenum. The polyp was removed with a lift and cut       technique using 5 mL Everlift and a cold snare. Resection and retrieval       were complete. Estimated blood loss was minimal. To prevent bleeding       after the polypectomy, three hemostatic clips were successfully placed       (MR conditional). There was no bleeding at the end of the procedure.      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 Coagulation for tissue destruction using argon plasma was  successful. Estimated blood loss was minimal.      The exam of the duodenum was otherwise normal.      Exam of the jejunum was otherwise normal. Impression:               - Normal esophagus.                           - Normal stomach.                           - A single duodenal polyp. Resected and retrieved.                            Clips (MR conditional) were placed.                           - A single non-bleeding angioectasia in the                            duodenum. Treated with argon plasma coagulation                            (APC). Moderate Sedation:       Not Applicable - Patient had care per Anesthesia. Recommendation:           - Discharge patient to home (with escort).                           - Resume previous diet.                           - Use Protonix (pantoprazole) 40 mg PO BID for 4                            weeks, then decrease to once daily.                           - Await pathology results.                           - Repeat the small bowel enteroscopy in 6 months                            for surveillance.                           - Avoid aspirin and NSAIDs for 2 weeks Procedure Code(s):        --- Professional ---                           [id number], Small intestinal endoscopy, enteroscopy                            beyond second portion of duodenum, not including  ileum; with removal of tumor(s), polyp(s), or other                            lesion(s) by snare technique Diagnosis Code(s):        --- Professional ---                           K31.7, Polyp of stomach and duodenum                           K31.819, Angiodysplasia of stomach and duodenum                            without bleeding                           D13.2, Benign neoplasm of duodenum                           K55.20, Angiodysplasia of colon without hemorrhage CPT copyright 2019 American Medical Association. All rights reserved. The codes documented in this report are preliminary and upon coder review may  be revised to meet current compliance requirements. Headley E. Candis Schatz, MD 11/16/2021 11:15:02 AM This report has been signed electronically. Number of Addenda: 0

## 2021-11-16 NOTE — H&P (Signed)
[facility] History and Physical   Primary Care Physician:  Wardell Honour, MD   Reason for Procedure:   Ablation of small bowel AVMs, EMR of duodenal polyp  Plan:    Enteroscopy with APC ablation, EGD with EMR     HPI: Bryan Stafford is a 76 y.o. 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  He was also found to have a duodenal polyp with endoscopic appearance consistent with an adenoma that will be removed with EMR.    Past Medical History:  Diagnosis Date   Allergy    Anemia    Anxiety    Depression    Diabetes mellitus without complication (HCC)    GERD (gastroesophageal reflux disease)    Hyperlipidemia    Hypertension    Sleep apnea     Past Surgical History:  Procedure Laterality Date   [place]      Prior to Admission medications   Medication Sig Start Date End Date Taking? Authorizing Provider  Cyanocobalamin (VITAMIN B 12 PO) Take 1,000 mcg by mouth.   Yes [provider]  ferrous sulfate 325 (65 FE) MG EC tablet Take 325 mg by mouth daily.   Yes [provider]  metFORMIN (GLUCOPHAGE) 1000 MG tablet TAKE 1 TABLET BY MOUTH TWICE DAILY 08/31/21  Yes Virgie Dad, MD  pantoprazole (PROTONIX) 40 MG tablet Take 1 tablet (40 mg total) by mouth daily. 10/04/21  Yes Wardell Honour, MD  ramipril (ALTACE) 5 MG capsule Take 5 mg by mouth daily.   Yes [provider]  rosuvastatin (CRESTOR) 10 MG tablet Take 1 tablet (10 mg total) by mouth daily. 10/04/21  Yes Wardell Honour, MD  sertraline (ZOLOFT) 50 MG tablet Take 1 tablet (50 mg total) by mouth daily. 10/04/21  Yes Wardell Honour, MD  tamsulosin (FLOMAX) 0.4 MG CAPS capsule Take 1 capsule (0.4 mg total) by mouth at bedtime. 10/04/21  Yes Wardell Honour, MD  meclizine (ANTIVERT) 12.5 MG tablet Take 1 tablet (12.5 mg  total) by mouth 3 (three) times daily as needed for dizziness. Patient not taking: Reported on 11/01/2021 05/17/21   Virgie Dad, MD    No current facility-administered medications for this encounter.    Allergies as of 10/26/2021   (No Known Allergies)    Family History  Problem Relation Age of Onset   Heart failure Mother    Hypertension Mother    Hyperlipidemia Mother    Heart disease Father    Hypertension Father    Hyperlipidemia Father    Sleep apnea Sister    COPD Sister    Colon cancer Neg Hx    Esophageal cancer Neg Hx    Stomach cancer Neg Hx    Rectal cancer Neg Hx     Social History   Socioeconomic History   Marital status: Married    Spouse name: Not on file   Number of children: 2   Years of education: Not on file   Highest education level: Master's degree (e.g., MA, MS, MEng, MEd, MSW, MBA)  Occupational History   Occupation: Dance movement psychotherapist    Comment: Transport planner   Occupation: retired  Tobacco Use   Smoking status: Never    Passive exposure: Never   Smokeless tobacco: Never  Vaping Use   Vaping Use: Never  used  Substance and Sexual Activity   Alcohol use: Yes    Alcohol/week: 1.0 standard drink of alcohol    Types: 1 Standard drinks or equivalent per week    Comment: wine occasional   Drug use: Never   Sexual activity: Not on file  Other Topics Concern   Not on file  Social History Narrative   Do you drink/eat things with caffeine:Yes Coffee and Tea   What year were you married? 1973   Do you live in a house, apartment, assisted living, condo, trailer, etc.? Duplex   Is it one or more stories? One   How many persons live in your home? 2- Wife and myself    Do you exercise? No                               Do you have a DNR form? No If not, do you want to discuss one? Yes   Do you have any difficulty bathing or dressing yourself? No   Do you have difficulty preparing food or eating?No   Do you have difficulty managing  your medications?No   Do you have any difficulty managing your finances? No   Do you have any difficulty affording your medications? No          Social Determinants of Radio broadcast assistant Strain: Not on file  Food Insecurity: Not on file  Transportation Needs: Not on file  Physical Activity: Not on file  Stress: Not on file  Social Connections: Not on file  Intimate Partner Violence: Not on file    Review of Systems:  All other review of systems negative except as mentioned in the HPI.  Physical Exam: Vital signs BP 129/85   Pulse (!) 58   Temp 97.7 F (36.5 C) (Temporal)   Resp (!) 22   Ht '5\' 11"'$  (1.803 m)   Wt 85.7 kg   SpO2 97%   BMI 26.35 kg/m   General:   Alert,  Well-developed, well-nourished, pleasant and cooperative in NAD Airway:  Mallampati 2 Lungs:  Clear throughout to auscultation.   Heart:  Regular rate and rhythm; no murmurs, clicks, rubs,  or gallops. Abdomen:  Soft, nontender and nondistended. Normal bowel sounds.   Neuro/Psych:  Normal mood and affect. A and O x 3   Nwosu E. Candis Schatz, MD [facility]

## 2021-11-16 NOTE — Transfer of Care (Signed)
Immediate Anesthesia Transfer of Care Note  Patient: Bryan Stafford  Procedure(s) Performed: ENTEROSCOPY SUBMUCOSAL LIFTING INJECTION HOT HEMOSTASIS (ARGON PLASMA COAGULATION/BICAP) POLYPECTOMY HEMOSTASIS CLIP PLACEMENT  Patient Location: PACU  Anesthesia Type:MAC  Level of Consciousness: awake, alert  and oriented  Airway & Oxygen Therapy: Patient Spontanous Breathing and Patient connected to face mask oxygen  Post-op Assessment: Report given to RN, Post -op Vital signs reviewed and stable and Patient moving all extremities X 4  Post vital signs: Reviewed and stable  Last Vitals:  Vitals Value Taken Time  BP 122/71   Temp    Pulse 79   Resp 13 11/16/21 1112  SpO2 100   Vitals shown include unvalidated device data.  Last Pain:  Vitals:   11/16/21 1011  TempSrc: Temporal  PainSc: 0-No pain         Complications: No notable events documented.

## 2021-11-16 NOTE — Discharge Instructions (Signed)
YOU HAD AN ENDOSCOPIC PROCEDURE TODAY: Refer to the procedure report and other information in the discharge instructions given to you for any specific questions about what was found during the examination. If this information does not answer your questions, please call [place] office at [phone] to clarify.  ° °YOU SHOULD EXPECT: Some feelings of bloating in the abdomen. Passage of more gas than usual. Walking can help get rid of the air that was put into your GI tract during the procedure and reduce the bloating.  ° °DIET: Your first meal following the procedure should be a light meal and then it is ok to progress to your normal diet. A half-sandwich or bowl of soup is an example of a good first meal. Heavy or fried foods are harder to digest and may make you feel nauseous or bloated. Drink plenty of fluids but you should avoid alcoholic beverages for 24 hours. ° °ACTIVITY: Your care partner should take you home directly after the procedure. You should plan to take it easy, moving slowly for the rest of the day. You can resume normal activity the day after the procedure however YOU SHOULD NOT DRIVE, use power tools, machinery or perform tasks that involve climbing or major physical exertion for 24 hours (because of the sedation medicines used during the test).  ° °SYMPTOMS TO REPORT IMMEDIATELY: °A gastroenterologist can be reached at any hour. Please call [phone]  for any of the following symptoms:  ° °Following upper endoscopy (EGD, EUS, ERCP, esophageal dilation) °Vomiting of blood or coffee ground material  °New, significant abdominal pain  °New, significant chest pain or pain under the shoulder blades  °Painful or persistently difficult swallowing  °New shortness of breath  °Black, tarry-looking or red, bloody stools ° °FOLLOW UP:  °If any biopsies were taken you will be contacted by phone or by letter within the next 1-3 weeks. Call [phone]  if you have not heard about the biopsies in 3 weeks.    °Please also call with any specific questions about appointments or follow up tests. ° °

## 2021-11-17 LAB — SURGICAL PATHOLOGY

## 2021-11-21 NOTE — Progress Notes (Signed)
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 It is recommended to repeat upper endoscopy in 6 months to ensure that there is no residual or recurrent polyp at the polypectomy site. This EGD can be performed at our endoscopy suite, not the hospital. We will contact you closer to the time to schedule the procedure.

## 2021-11-29 ENCOUNTER — Ambulatory Visit (HOSPITAL_BASED_OUTPATIENT_CLINIC_OR_DEPARTMENT_OTHER): Payer: Medicare Other | Admitting: Psychiatry

## 2021-11-29 DIAGNOSIS — F4323 Adjustment disorder with mixed anxiety and depressed mood: Secondary | ICD-10-CM | POA: Diagnosis not present

## 2021-11-29 NOTE — Progress Notes (Signed)
Psychiatric Initial Adult Assessment   Patient Identification: Bryan Stafford MRN:  [id number] Date of Evaluation:  11/29/2021 Referral Source: Self referred Chief Complaint:   Visit Diagnosis:   History of Present Illness:  This patient is a 76 year old white male who is self-referred.  He and his wife moved from [place] after living there for decades to this community.  The patient and his wife were in friend's home.  The patient is here because he makes very inappropriate caustic remarks and gets in trouble.  This is the way he has been for the last 10 to 15 years.  He clearly appears to be characterological in nature.  He denies any depression.  He denies any psychiatric symptoms at all.  He says he sleeps 12 hours which he thinks is related to the anemia that he has.  He had multiple endoscopies and colonoscopies without conclusions.  He denies a feeling of worthlessness he is not suicidal and never been suicidal and is not now.  He loves to read and watch TV.  He is very happy with life.  The reason he is here is because his wife insisted.  He received Zoloft many years ago from her primary care doctor for reasons that he cannot describe.  He denies use of alcohol or drugs he has never been psychotic.  He shows no clear episodes of major depression or mania.  He has no symptoms of an anxiety disorder.  He has main conflicts with his wife.  He has issues with this because his son is having trouble finding employment.  His daughter is an emergency room physician who thinks the patient has autism spectrum disorders.  Patient is concerned about money.  He mainly has been conflicts with his wife. His medical illnesses include anemia Psychiatrically he denies any psychiatric history.  He has never been in a psychiatric hospital never seen a psychiatrist for and has never been in therapy.  The patient is intent is to find someone to talk to about him and how he feels about his wife.  It should be noted that the  patient refuses to have his wife come for this evaluation.  She will send me a letter that essentially describes his inappropriate behavior.  Associated Signs/Symptoms: Depression Symptoms:  anxiety, (Hypo) Manic Symptoms:   Anxiety Symptoms:   Psychotic Symptoms:   PTSD Symptoms: Negative  Past Psychiatric History:   Previous Psychotropic Medications: No   Substance Abuse History in the last 12 months:  No.  Consequences of Substance Abuse: NA  Past Medical History:  Past Medical History:  Diagnosis Date   Allergy    Anemia    Anxiety    Depression    Diabetes mellitus without complication ([place])    GERD (gastroesophageal reflux disease)    Hyperlipidemia    Hypertension    Sleep apnea     Past Surgical History:  Procedure Laterality Date   APPENDECTOMY  1952   ENTEROSCOPY N/A 11/16/2021   Procedure: ENTEROSCOPY;  Surgeon: Daryel November, MD;  Location: [facility];  Service: Gastroenterology;  Laterality: N/A;   HEMOSTASIS CLIP PLACEMENT  11/16/2021   Procedure: HEMOSTASIS CLIP PLACEMENT;  Surgeon: Daryel November, MD;  Location: [facility];  Service: Gastroenterology;;   HOT HEMOSTASIS N/A 11/16/2021   Procedure: HOT HEMOSTASIS (ARGON PLASMA COAGULATION/BICAP);  Surgeon: Daryel November, MD;  Location: [facility];  Service: Gastroenterology;  Laterality: N/A;   LUMBAR DISC SURGERY     POLYPECTOMY  11/16/2021   Procedure:  POLYPECTOMY;  Surgeon: Daryel November, MD;  Location: [facility];  Service: Gastroenterology;;   [facility]ia [place] LIFTING INJECTION  11/16/2021   Procedure: SUBMUCOSAL LIFTING INJECTION;  Surgeon: Daryel November, MD;  Location: [facility];  Service: Gastroenterology;;    Family Psychiatric History:   Family History:  Family History  Problem Relation Age of Onset   Heart failure Mother    Hypertension Mother    Hyperlipidemia Mother    Heart disease Father    Hypertension Father    Hyperlipidemia Father    Sleep apnea Sister     COPD Sister    Colon cancer Neg Hx    Esophageal cancer Neg Hx    Stomach cancer Neg Hx    Rectal cancer Neg Hx     Social History:   Social History   Socioeconomic History   Marital status: Married    Spouse name: Not on file   Number of children: 2   Years of education: Not on file   Highest education level: Master's degree (e.g., MA, MS, MEng, MEd, MSW, MBA)  Occupational History   Occupation: Dance movement psychotherapist    Comment: Transport planner   Occupation: retired  Tobacco Use   Smoking status: Never    Passive exposure: Never   Smokeless tobacco: Never  Vaping Use   Vaping Use: Never used  Substance and Sexual Activity   Alcohol use: Yes    Alcohol/week: 1.0 standard drink of alcohol    Types: 1 Standard drinks or equivalent per week    Comment: wine occasional   Drug use: Never   Sexual activity: Not on file  Other Topics Concern   Not on file  Social History Narrative   Do you drink/eat things with caffeine:Yes Coffee and Tea   What year were you married? 1973   Do you live in a house, apartment, assisted living, condo, trailer, etc.? Duplex   Is it one or more stories? One   How many persons live in your home? 2- Wife and myself    Do you exercise? No                               Do you have a DNR form? No If not, do you want to discuss one? Yes   Do you have any difficulty bathing or dressing yourself? No   Do you have difficulty preparing food or eating?No   Do you have difficulty managing your medications?No   Do you have any difficulty managing your finances? No   Do you have any difficulty affording your medications? No          Social Determinants of Radio broadcast assistant Strain: Not on file  Food Insecurity: Not on file  Transportation Needs: Not on file  Physical Activity: Not on file  Stress: Not on file  Social Connections: Not on file    Additional Social History:   Allergies:  No Known Allergies  Metabolic Disorder  Labs: Lab Results  Component Value Date   HGBA1C 7.0 (H) 11/01/2021   MPG 154 11/01/2021   MPG 140 04/26/2021   No results found for: "PROLACTIN" Lab Results  Component Value Date   CHOL 138 04/26/2021   TRIG 119 04/26/2021   HDL 56 04/26/2021   CHOLHDL 2.5 04/26/2021   LDLCALC 61 04/26/2021   Lab Results  Component Value Date   TSH 1.17 11/18/2020    Therapeutic  Level Labs: No results found for: "LITHIUM" No results found for: "CBMZ" No results found for: "VALPROATE"  Current Medications: Current Outpatient Medications  Medication Sig Dispense Refill   Cyanocobalamin (VITAMIN B 12 PO) Take 1,000 mcg by mouth.     ferrous sulfate 325 (65 FE) MG EC tablet Take 325 mg by mouth daily.     meclizine (ANTIVERT) 12.5 MG tablet Take 1 tablet (12.5 mg total) by mouth 3 (three) times daily as needed for dizziness. (Patient not taking: Reported on 11/01/2021) 30 tablet 0   metFORMIN (GLUCOPHAGE) 1000 MG tablet TAKE 1 TABLET BY MOUTH TWICE DAILY 180 tablet 1   pantoprazole (PROTONIX) 40 MG tablet Take 1 tablet (40 mg total) by mouth 2 (two) times daily. 90 tablet 3   ramipril (ALTACE) 5 MG capsule Take 5 mg by mouth daily.     rosuvastatin (CRESTOR) 10 MG tablet Take 1 tablet (10 mg total) by mouth daily. 90 tablet 2   sertraline (ZOLOFT) 50 MG tablet Take 1 tablet (50 mg total) by mouth daily. 30 tablet 3   tamsulosin (FLOMAX) 0.4 MG CAPS capsule Take 1 capsule (0.4 mg total) by mouth at bedtime. 90 capsule 2   No current facility-administered medications for this visit.    Musculoskeletal: Strength & Muscle Tone: within normal limits Gait & Station: normal Patient leans: N/A  Psychiatric Specialty Exam: Review of Systems  There were no vitals taken for this visit.There is no height or weight on file to calculate BMI.  General Appearance: Casual  Eye Contact:  Good  Speech:  NA  Volume:  Normal  Mood:  Negative  Affect:  Appropriate  Thought Process:  Goal Directed   Orientation:  Full (Time, Place, and Person)  Thought Content:  Logical  Suicidal Thoughts:  No  Homicidal Thoughts:  No  Memory:  NA  Judgement:  Fair  Insight:  Shallow  Psychomotor Activity:  Normal  Concentration:    Recall:  Good  Fund of Knowledge:Good  Language: Good  Akathisia:  No  Handed:    AIMS (if indicated):  not done  Assets:  Desire for Improvement  ADL's:    Cognition:   Sleep:  Good   Screenings: Flowsheet Row Admission (Discharged) from 11/16/2021 in [facility] No Risk       Assessment and Plan:   At this time this patient seems to have issues with his character seems to be mildly irritable and quite inappropriate.  This gets him in trouble socially.  It is not clear how much his anemia is affecting his personality or his energy level.  At this time I recommended he continue taking Zoloft and that he continues to see me in a repeat evaluation when his next visit.  I do not think he is dangerous to himself or to anyone else.  He shows no real evidence of psychosis.  I think there are characterological issues in this individual.  He will return to see me in 2 to 3 months.  Collaboration of Care:   Patient/Guardian was advised Release of Information must be obtained prior to any record release in order to collaborate their care with an outside provider. Patient/Guardian was advised if they have not already done so to contact the registration department to sign all necessary forms in order for [place] to release information regarding their care.   Consent: Patient/Guardian gives verbal consent for treatment and assignment of benefits for services provided during this  visit. Patient/Guardian expressed understanding and agreed to proceed.   Jerral Ralph, MD 6/21/20233:47 PM

## 2022-02-02 ENCOUNTER — Other Ambulatory Visit: Payer: Self-pay | Admitting: *Deleted

## 2022-02-02 MED ORDER — RAMIPRIL 5 MG PO CAPS: 5.0000 mg | ORAL_CAPSULE | Freq: Every day | ORAL | 0 refills | Status: DC

## 2022-03-04 ENCOUNTER — Other Ambulatory Visit: Payer: Self-pay | Admitting: Internal Medicine

## 2022-03-06 ENCOUNTER — Encounter: Payer: Self-pay | Admitting: Adult Health

## 2022-03-06 ENCOUNTER — Ambulatory Visit (INDEPENDENT_AMBULATORY_CARE_PROVIDER_SITE_OTHER): Payer: Medicare Other | Admitting: Adult Health

## 2022-03-06 VITALS — BP 101/66 | HR 76 | Temp 97.8°F | Ht 71.0 in | Wt 189.0 lb

## 2022-03-06 DIAGNOSIS — E1169 Type 2 diabetes mellitus with other specified complication: Secondary | ICD-10-CM

## 2022-03-06 DIAGNOSIS — F32A Depression, unspecified: Secondary | ICD-10-CM | POA: Diagnosis not present

## 2022-03-06 DIAGNOSIS — D509 Iron deficiency anemia, unspecified: Secondary | ICD-10-CM

## 2022-03-06 DIAGNOSIS — I10 Essential (primary) hypertension: Secondary | ICD-10-CM | POA: Diagnosis not present

## 2022-03-06 MED ORDER — RAMIPRIL 5 MG PO CAPS: 5.0000 mg | ORAL_CAPSULE | Freq: Every day | ORAL | 3 refills | Status: DC

## 2022-03-06 MED ORDER — SERTRALINE HCL 50 MG PO TABS: 75.0000 mg | ORAL_TABLET | Freq: Every day | ORAL | 3 refills | Status: DC

## 2022-03-06 NOTE — Patient Instructions (Signed)
Hypertension, Adult ?Hypertension is another name for high blood pressure. High blood pressure forces your heart to work harder to pump blood. This can cause problems over time. ?There are two numbers in a blood pressure reading. There is a top number (systolic) over a bottom number (diastolic). It is best to have a blood pressure that is below 120/80. ?What are the causes? ?The cause of this condition is not known. Some other conditions can lead to high blood pressure. ?What increases the risk? ?Some lifestyle factors can make you more likely to develop high blood pressure: ?Smoking. ?Not getting enough exercise or physical activity. ?Being overweight. ?Having too much fat, sugar, calories, or salt (sodium) in your diet. ?Drinking too much alcohol. ?Other risk factors include: ?Having any of these conditions: ?Heart disease. ?Diabetes. ?High cholesterol. ?Kidney disease. ?Obstructive sleep apnea. ?Having a family history of high blood pressure and high cholesterol. ?Age. The risk increases with age. ?Stress. ?What are the signs or symptoms? ?High blood pressure may not cause symptoms. Very high blood pressure (hypertensive crisis) may cause: ?Headache. ?Fast or uneven heartbeats (palpitations). ?Shortness of breath. ?Nosebleed. ?Vomiting or feeling like you may vomit (nauseous). ?Changes in how you see. ?Very bad chest pain. ?Feeling dizzy. ?Seizures. ?How is this treated? ?This condition is treated by making healthy lifestyle changes, such as: ?Eating healthy foods. ?Exercising more. ?Drinking less alcohol. ?Your doctor may prescribe medicine if lifestyle changes do not help enough and if: ?Your top number is above 130. ?Your bottom number is above 80. ?Your personal target blood pressure may vary. ?Follow these instructions at home: ?Eating and drinking ? ?If told, follow the DASH eating plan. To follow this plan: ?Fill one half of your plate at each meal with fruits and vegetables. ?Fill one fourth of your plate  at each meal with whole grains. Whole grains include whole-wheat pasta, brown rice, and whole-grain bread. ?Eat or drink low-fat dairy products, such as skim milk or low-fat yogurt. ?Fill one fourth of your plate at each meal with low-fat (lean) proteins. Low-fat proteins include fish, chicken without skin, eggs, beans, and tofu. ?Avoid fatty meat, cured and processed meat, or chicken with skin. ?Avoid pre-made or processed food. ?Limit the amount of salt in your diet to less than 1,500 mg each day. ?Do not drink alcohol if: ?Your doctor tells you not to drink. ?You are pregnant, may be pregnant, or are planning to become pregnant. ?If you drink alcohol: ?Limit how much you have to: ?0-1 drink a day for women. ?0-2 drinks a day for men. ?Know how much alcohol is in your drink. In the [place] one drink equals one 12 oz bottle of beer (355 mL), one 5 oz glass of wine (148 mL), or one 1? oz glass of hard liquor (44 mL). ?Lifestyle ? ?Work with your doctor to stay at a healthy weight or to lose weight. Ask your doctor what the best weight is for you. ?Get at least 30 minutes of exercise that causes your heart to beat faster (aerobic exercise) most days of the week. This may include walking, swimming, or biking. ?Get at least 30 minutes of exercise that strengthens your muscles (resistance exercise) at least 3 days a week. This may include lifting weights or doing Pilates. ?Do not smoke or use any products that contain nicotine or tobacco. If you need help quitting, ask your doctor. ?Check your blood pressure at home as told by your doctor. ?Keep all follow-up visits. ?Medicines ?Take over-the-counter and prescription medicines   only as told by your doctor. Follow directions carefully. ?Do not skip doses of blood pressure medicine. The medicine does not work as well if you skip doses. Skipping doses also puts you at risk for problems. ?Ask your doctor about side effects or reactions to medicines that you should watch  for. ?Contact a doctor if: ?You think you are having a reaction to the medicine you are taking. ?You have headaches that keep coming back. ?You feel dizzy. ?You have swelling in your ankles. ?You have trouble with your vision. ?Get help right away if: ?You get a very bad headache. ?You start to feel mixed up (confused). ?You feel weak or numb. ?You feel faint. ?You have very bad pain in your: ?Chest. ?Belly (abdomen). ?You vomit more than once. ?You have trouble breathing. ?These symptoms may be an emergency. Get help right away. Call 911. ?Do not wait to see if the symptoms will go away. ?Do not drive yourself to the hospital. ?Summary ?Hypertension is another name for high blood pressure. ?High blood pressure forces your heart to work harder to pump blood. ?For most people, a normal blood pressure is less than 120/80. ?Making healthy choices can help lower blood pressure. If your blood pressure does not get lower with healthy choices, you may need to take medicine. ?This information is not intended to replace advice given to you by your health care provider. Make sure you discuss any questions you have with your health care provider. ?Document Revised: 03/16/2021 Document Reviewed: 03/16/2021 ?Elsevier Patient Education ? 2023 Elsevier Inc. ? ?

## 2022-03-06 NOTE — Progress Notes (Signed)
[facility]  Provider:   Durenda Age DNP  Code Status:  Full Code  Goals of Care:     11/16/2021    9:56 AM  Advanced Directives  Does Patient Have a Medical Advance Directive? Yes  Type of Paramedic of [place];Living will  Copy of [place] in Chart? No - copy requested     Chief Complaint  Patient presents with   Medical Management of Chronic Issues    Needs refill of Ramipril.    HPI: Patient is a 76 y.o. male seen today for management of chronic diseases. He has a PMH of depression, GERD, diabetes mellitus, sleep apnea and hypertension.  Primary hypertension -  BP 101/66, takes Ramipril, wanting prescription refill  Depression, unspecified depression type - feels depressed but does not have plans of hurting himself. Occasionally, he would answer in [place] and [place] words and confused why provider cannot understand. He verbalized "let's get out of here!" He stated that he gets upset a lot of times.  Iron deficiency anemia, unspecified iron deficiency anemia type -  hgb 10.3, takes ferrous sulfate  Type 2 diabetes mellitus with other specified complication, without long-term current use of insulin (HCC)  -  A1c 7.0, takes Metformin    Past Medical History:  Diagnosis Date   Allergy    Anemia    Anxiety    Depression    Diabetes mellitus without complication (HCC)    GERD (gastroesophageal reflux disease)    Hyperlipidemia    Hypertension    Sleep apnea     Past Surgical History:  Procedure Laterality Date   APPENDECTOMY  1952   ENTEROSCOPY N/A 11/16/2021   Procedure: ENTEROSCOPY;  Surgeon: Daryel November, MD;  Location: [facility];  Service: Gastroenterology;  Laterality: N/A;   HEMOSTASIS CLIP PLACEMENT  11/16/2021   Procedure: HEMOSTASIS CLIP PLACEMENT;  Surgeon: Daryel November, MD;  Location: [facility];  Service: Gastroenterology;;   HOT HEMOSTASIS N/A 11/16/2021   Procedure: HOT HEMOSTASIS  (ARGON PLASMA COAGULATION/BICAP);  Surgeon: Daryel November, MD;  Location: [facility];  Service: Gastroenterology;  Laterality: N/A;   LUMBAR DISC SURGERY     POLYPECTOMY  11/16/2021   Procedure: POLYPECTOMY;  Surgeon: Daryel November, MD;  Location: [facility];  Service: Gastroenterology;;   SUBMUCOSAL LIFTING INJECTION  11/16/2021   Procedure: SUBMUCOSAL LIFTING INJECTION;  Surgeon: Daryel November, MD;  Location: [facility];  Service: Gastroenterology;;    No Known Allergies  Outpatient Encounter Medications as of 03/06/2022  Medication Sig   Cyanocobalamin (VITAMIN B 12 PO) Take 1,000 mcg by mouth.   ferrous sulfate 325 (65 FE) MG EC tablet Take 325 mg by mouth daily.   metFORMIN (GLUCOPHAGE) 1000 MG tablet TAKE 1 TABLET BY MOUTH TWICE DAILY   pantoprazole (PROTONIX) 40 MG tablet Take 1 tablet (40 mg total) by mouth 2 (two) times daily.   ramipril (ALTACE) 5 MG capsule Take 1 capsule (5 mg total) by mouth daily. Needs an appointment before anymore future refills.   rosuvastatin (CRESTOR) 10 MG tablet Take 1 tablet (10 mg total) by mouth daily.   sertraline (ZOLOFT) 50 MG tablet Take 1 tablet (50 mg total) by mouth daily.   meclizine (ANTIVERT) 12.5 MG tablet Take 1 tablet (12.5 mg total) by mouth 3 (three) times daily as needed for dizziness.   tamsulosin (FLOMAX) 0.4 MG CAPS capsule Take 1 capsule (0.4 mg total) by mouth at bedtime.   No facility-administered  encounter medications on file as of 03/06/2022.    Review of Systems:  Review of Systems  Constitutional:  Negative for activity change, appetite change and fever.  HENT:  Negative for sore throat.   Eyes: Negative.   Cardiovascular:  Negative for chest pain and leg swelling.  Gastrointestinal:  Negative for abdominal distention, diarrhea and vomiting.  Genitourinary:  Negative for dysuria, frequency and urgency.  Skin:  Negative for color change.  Neurological:  Negative for dizziness and headaches.   Psychiatric/Behavioral:  Positive for agitation. Negative for behavioral problems and sleep disturbance. The patient is not nervous/anxious.     Health Maintenance  Topic Date Due   Diabetic kidney evaluation - Urine ACR  Never done   Hepatitis C Screening  Never done   Zoster Vaccines- Shingrix (1 of 2) Never done   COVID-19 Vaccine (2 - Moderna series) 02/21/2021   INFLUENZA VACCINE  01/09/2022   Pneumonia Vaccine 21+ Years old (1 - PCV) 07/19/2022 (Originally 03/30/2009)   TETANUS/TDAP  07/19/2022 (Originally 03/31/1965)   Diabetic kidney evaluation - GFR measurement  07/17/2022   COLONOSCOPY (Pts 45-38yr Insurance coverage will need to be confirmed)  10/25/2024   HPV VACCINES  Aged Out    Physical Exam: Vitals:   03/06/22 1257  BP: 101/66  Pulse: 76  Temp: 97.8 F (36.6 C)  TempSrc: Skin  SpO2: 96%  Weight: 189 lb (85.7 kg)  Height: '5\' 11"'$  (1.803 m)   Body mass index is 26.36 kg/m. Physical Exam Constitutional:      Appearance: Normal appearance.  HENT:     Head: Normocephalic and atraumatic.     Mouth/Throat:     Mouth: Mucous membranes are moist.  Eyes:     Conjunctiva/sclera: Conjunctivae normal.  Cardiovascular:     Rate and Rhythm: Normal rate and regular rhythm.     Pulses: Normal pulses.     Heart sounds: Normal heart sounds.  Pulmonary:     Effort: Pulmonary effort is normal.     Breath sounds: Normal breath sounds.  Abdominal:     General: Bowel sounds are normal.     Palpations: Abdomen is soft.  Musculoskeletal:        General: No swelling. Normal range of motion.     Cervical back: Normal range of motion.  Skin:    General: Skin is warm and dry.  Neurological:     General: No focal deficit present.     Mental Status: He is alert and oriented to person, place, and time.  Psychiatric:        Mood and Affect: Mood normal.        Behavior: Behavior normal.        Thought Content: Thought content normal.        Judgment: Judgment normal.      Labs reviewed: Basic Metabolic Panel: Recent Labs    04/26/21 0820 07/17/21 0745  NA 137 138  K 5.3 5.2  CL 103 102  CO2 24 27  GLUCOSE 112* 110*  BUN 11 14  CREATININE 1.14 1.24  CALCIUM 10.6* 10.3   Liver Function Tests: Recent Labs    04/26/21 0820 07/17/21 0745  AST 13 15  ALT 8* 9  BILITOT 0.3 0.4  PROT 7.0 6.9   No results for input(s): "LIPASE", "AMYLASE" in the last 8760 hours. No results for input(s): "AMMONIA" in the last 8760 hours. CBC: Recent Labs    04/26/21 0820 07/17/21 0745  WBC 8.7 8.6  NEUTROABS 5,342  5,151  HGB 8.9* 10.3*  HCT 31.6* 36.1*  MCV 66.5* 71.1*  PLT 416* 359   Lipid Panel: Recent Labs    04/26/21 0820  CHOL 138  HDL 56  LDLCALC 61  TRIG 119  CHOLHDL 2.5   Lab Results  Component Value Date   HGBA1C 7.0 (H) 11/01/2021    Procedures since last visit: No results found.  Assessment/Plan  1. Primary hypertension -  BP stable - ramipril (ALTACE) 5 MG capsule; Take 1 capsule (5 mg total) by mouth daily. Needs an appointment before anymore future refills.  Dispense: 30 capsule; Refill: 3  2. Depression, unspecified depression type -  stated that he feels depressed but denies plans of hurting himself -  will increase Sertraline from 50 mg to 75 mg daily - sertraline (ZOLOFT) 50 MG tablet; Take 1.5 tablets (75 mg total) by mouth daily.  Dispense: 30 tablet; Refill: 3  3. Iron deficiency anemia, unspecified iron deficiency anemia type Lab Results  Component Value Date   HGB 10.3 (L) 07/17/2021   -  continue FeSO4  4. Type 2 diabetes mellitus with other specified complication, without long-term current use of insulin (HCC) Lab Results  Component Value Date   HGBA1C 7.0 (H) 11/01/2021   -  continue Metformin -  instructed to monitor blood sugar and log   Labs/tests ordered:   None  Next appt:   as needed

## 2022-03-21 ENCOUNTER — Ambulatory Visit (HOSPITAL_BASED_OUTPATIENT_CLINIC_OR_DEPARTMENT_OTHER): Payer: Medicare Other | Admitting: Psychiatry

## 2022-03-21 DIAGNOSIS — F4323 Adjustment disorder with mixed anxiety and depressed mood: Secondary | ICD-10-CM | POA: Diagnosis not present

## 2022-03-21 NOTE — Progress Notes (Signed)
Psychiatric Initial Adult Assessment   Patient Identification: Bryan Stafford MRN:  [id number] Date of Evaluation:  03/21/2022 Referral Source: Self referred Chief Complaint:   Visit Diagnosis:      Today the patient is seen for her second visit.  Patient actually doing better.  I think the situational change of moving to a new apartment has made a big difference.  He says his mood is much improved.  He is sleeping and eating pretty well.  He has mild chronic anxiety.  I suspect to some degree he has some features of generalized anxiety disorder.  The patient says his wife is doing well.  I think there have been some issues between her and him and the possibility of marital treatment would not be a bad idea.  Nonetheless apparently she is resistant to the idea.  I think the patient is not necessarily heading in that direction either.  The patient overall feels pretty well.  His primary care doctors recently increased his Zoloft to 100 mg which is of course reasonable.  Associated Signs/Symptoms: Depression Symptoms:  anxiety, (Hypo) Manic Symptoms:   Anxiety Symptoms:   Psychotic Symptoms:   PTSD Symptoms: Negative  Past Psychiatric History:   Previous Psychotropic Medications: No   Substance Abuse History in the last 12 months:  No.  Consequences of Substance Abuse: NA  Past Medical History:  Past Medical History:  Diagnosis Date   Allergy    Anemia    Anxiety    Depression    Diabetes mellitus without complication ([place])    GERD (gastroesophageal reflux disease)    Hyperlipidemia    Hypertension    Sleep apnea     Past Surgical History:  Procedure Laterality Date   APPENDECTOMY  1952   ENTEROSCOPY N/A 11/16/2021   Procedure: ENTEROSCOPY;  Surgeon: Daryel November, MD;  Location: [facility];  Service: Gastroenterology;  Laterality: N/A;   HEMOSTASIS CLIP PLACEMENT  11/16/2021   Procedure: HEMOSTASIS CLIP PLACEMENT;  Surgeon: Daryel November, MD;  Location: [facility];  Service: Gastroenterology;;   HOT HEMOSTASIS N/A 11/16/2021   Procedure: HOT HEMOSTASIS (ARGON PLASMA COAGULATION/BICAP);  Surgeon: Daryel November, MD;  Location: [facility];  Service: Gastroenterology;  Laterality: N/A;   LUMBAR DISC SURGERY     POLYPECTOMY  11/16/2021   Procedure: POLYPECTOMY;  Surgeon: Daryel November, MD;  Location: [facility];  Service: Gastroenterology;;   Lia Foyer LIFTING INJECTION  11/16/2021   Procedure: SUBMUCOSAL LIFTING INJECTION;  Surgeon: Daryel November, MD;  Location: [facility];  Service: Gastroenterology;;    Family Psychiatric History:   Family History:  Family History  Problem Relation Age of Onset   Heart failure Mother    Hypertension Mother    Hyperlipidemia Mother    Heart disease Father    Hypertension Father    Hyperlipidemia Father    Sleep apnea Sister    COPD Sister    Colon cancer Neg Hx    Esophageal cancer Neg Hx    Stomach cancer Neg Hx    Rectal cancer Neg Hx     Social History:   Social History   Socioeconomic History   Marital status: Married    Spouse name: Not on file   Number of children: 2   Years of education: Not on file   Highest education level: Master's degree (e.g., MA, MS, MEng, MEd, MSW, MBA)  Occupational History   Occupation: Controller    Comment: Financial controller [employer]   Occupation: retired  Tobacco Use   Smoking status: Never    Passive exposure: Never   Smokeless tobacco: Never  Vaping Use   Vaping Use: Never used  Substance and Sexual Activity   Alcohol use: Yes    Alcohol/week: 1.0 standard drink of alcohol    Types: 1 Standard drinks or equivalent per week    Comment: wine occasional   Drug use: Never   Sexual activity: Not on file  Other Topics Concern   Not on file  Social History Narrative   Do you drink/eat things with caffeine:Yes Coffee and Tea   What year were you married? 1973   Do you live in a house, apartment, assisted living, condo,  trailer, etc.? Duplex   Is it one or more stories? One   How many persons live in your home? 2- Wife and myself    Do you exercise? No                               Do you have a DNR form? No If not, do you want to discuss one? Yes   Do you have any difficulty bathing or dressing yourself? No   Do you have difficulty preparing food or eating?No   Do you have difficulty managing your medications?No   Do you have any difficulty managing your finances? No   Do you have any difficulty affording your medications? No          Social Determinants of Radio broadcast assistant Strain: Not on file  Food Insecurity: Not on file  Transportation Needs: Not on file  Physical Activity: Not on file  Stress: Not on file  Social Connections: Not on file    Additional Social History:   Allergies:  No Known Allergies  Metabolic Disorder Labs: Lab Results  Component Value Date   HGBA1C 7.0 (H) 11/01/2021   MPG 154 11/01/2021   MPG 140 04/26/2021   No results found for: "PROLACTIN" Lab Results  Component Value Date   CHOL 138 04/26/2021   TRIG 119 04/26/2021   HDL 56 04/26/2021   CHOLHDL 2.5 04/26/2021   LDLCALC 61 04/26/2021   Lab Results  Component Value Date   TSH 1.17 11/18/2020    Therapeutic Level Labs: No results found for: "LITHIUM" No results found for: "CBMZ" No results found for: "VALPROATE"  Current Medications: Current Outpatient Medications  Medication Sig Dispense Refill   Cyanocobalamin (VITAMIN B 12 PO) Take 1,000 mcg by mouth.     ferrous sulfate 325 (65 FE) MG EC tablet Take 325 mg by mouth daily.     meclizine (ANTIVERT) 12.5 MG tablet Take 1 tablet (12.5 mg total) by mouth 3 (three) times daily as needed for dizziness. 30 tablet 0   metFORMIN (GLUCOPHAGE) 1000 MG tablet TAKE 1 TABLET BY MOUTH TWICE DAILY 180 tablet 1   pantoprazole (PROTONIX) 40 MG tablet Take 1 tablet (40 mg total) by mouth 2 (two) times daily. 90 tablet 3   ramipril (ALTACE) 5 MG  capsule Take 1 capsule (5 mg total) by mouth daily. Needs an appointment before anymore future refills. 30 capsule 3   rosuvastatin (CRESTOR) 10 MG tablet Take 1 tablet (10 mg total) by mouth daily. 90 tablet 2   sertraline (ZOLOFT) 50 MG tablet Take 1.5 tablets (75 mg total) by mouth daily. 30 tablet 3   tamsulosin (FLOMAX) 0.4 MG CAPS capsule Take 1 capsule (0.4 mg total) by  mouth at bedtime. 90 capsule 2   No current facility-administered medications for this visit.    Musculoskeletal: Strength & Muscle Tone: within normal limits Gait & Station: normal Patient leans: N/A  Psychiatric Specialty Exam: Review of Systems  There were no vitals taken for this visit.There is no height or weight on file to calculate BMI.  General Appearance: Casual  Eye Contact:  Good  Speech:  NA  Volume:  Normal  Mood:  Negative  Affect:  Appropriate  Thought Process:  Goal Directed  Orientation:  Full (Time, Place, and Person)  Thought Content:  Logical  Suicidal Thoughts:  No  Homicidal Thoughts:  No  Memory:  NA  Judgement:  Fair  Insight:  Shallow  Psychomotor Activity:  Normal  Concentration:    Recall:  Good  Fund of Knowledge:Good  Language: Good  Akathisia:  No  Handed:    AIMS (if indicated):  not done  Assets:  Desire for Improvement  ADL's:    Cognition:   Sleep:  Good   Screenings: Flowsheet Row Admission (Discharged) from 11/16/2021 in [place] No Risk       Assessment and Plan:    This patient's diagnosis is either adjustment disorder with depressed mood state or would be generalized anxiety disorder.  Either way 100 mg of Zoloft seems perfectly appropriate.  The patient is functioning quite well.  I believe he is looking forward to this move to a new apartment.  His health is stable.  At this time I see no reason for him to return to this setting.  I think he is stable and doing reasonably well.  We discussed this  plan in this approach and he agreed.  I believe he will continue taking Zoloft from his primary care doctor.  He will not make another appointment. Collaboration of Care:   Patient/Guardian was advised Release of Information must be obtained prior to any record release in order to collaborate their care with an outside provider. Patient/Guardian was advised if they have not already done so to contact the registration department to sign all necessary forms in order for [place] to release information regarding their care.   Consent: Patient/Guardian gives verbal consent for treatment and assignment of benefits for services provided during this visit. Patient/Guardian expressed understanding and agreed to proceed.   Jerral Ralph, MD 10/11/20233:39 PM

## 2022-05-29 ENCOUNTER — Other Ambulatory Visit: Payer: Self-pay | Admitting: *Deleted

## 2022-05-29 DIAGNOSIS — F32A Depression, unspecified: Secondary | ICD-10-CM

## 2022-05-29 MED ORDER — SERTRALINE HCL 50 MG PO TABS: 75.0000 mg | ORAL_TABLET | Freq: Every day | ORAL | 3 refills | Status: DC

## 2022-05-29 NOTE — Telephone Encounter (Signed)
Patient requested refill to be sent to pharmacy. Aware to schedule follow up appointment.

## 2022-06-25 ENCOUNTER — Encounter: Payer: Self-pay | Admitting: Gastroenterology

## 2022-07-02 ENCOUNTER — Other Ambulatory Visit: Payer: Self-pay | Admitting: Adult Health

## 2022-07-02 DIAGNOSIS — I10 Essential (primary) hypertension: Secondary | ICD-10-CM

## 2022-07-04 ENCOUNTER — Ambulatory Visit (INDEPENDENT_AMBULATORY_CARE_PROVIDER_SITE_OTHER): Payer: Medicare Other | Admitting: Family Medicine

## 2022-07-04 ENCOUNTER — Encounter: Payer: Self-pay | Admitting: Family Medicine

## 2022-07-04 VITALS — BP 116/70 | HR 69 | Temp 96.8°F | Ht 71.0 in | Wt 194.0 lb

## 2022-07-04 DIAGNOSIS — F32A Depression, unspecified: Secondary | ICD-10-CM

## 2022-07-04 DIAGNOSIS — E1169 Type 2 diabetes mellitus with other specified complication: Secondary | ICD-10-CM | POA: Diagnosis not present

## 2022-07-04 DIAGNOSIS — E119 Type 2 diabetes mellitus without complications: Secondary | ICD-10-CM | POA: Diagnosis not present

## 2022-07-04 DIAGNOSIS — D509 Iron deficiency anemia, unspecified: Secondary | ICD-10-CM | POA: Diagnosis not present

## 2022-07-04 DIAGNOSIS — I10 Essential (primary) hypertension: Secondary | ICD-10-CM | POA: Diagnosis not present

## 2022-07-04 NOTE — Patient Instructions (Addendum)
Please schedule an annual wellness visit with one of my nurse practitioners. Our records do not reflect you having an annual wellness visit within the last 12 months. During this visit the nurse practitioner will review your health care needs and focus on scheduling screenings that you may need, along with updating/recommending immunizations for you.

## 2022-07-04 NOTE — Progress Notes (Signed)
Provider:  Alain Honey, MD  Careteam: Patient Care Team: Wardell Honour, MD as PCP - General (Family Medicine)  PLACE OF SERVICE:  [place] Directive information    No Known Allergies  Chief Complaint  Patient presents with   Medical Management of Chronic Issues    Patient presents today for 4 month follow-up   Quality Metric Gaps    AWV,urine microalbumin, Hep C screening, TDAP,zoster, COVID#7     HPI: Patient is a 77 y.o. male.  Patient has been seen several times.  There is a problem with mood disorder.  He is recently seen psychiatrist who has dismissed him.  He continues to take metformin and ramipril. Today he describes some tearing and crusting of his eyelids worse in the mornings.  He denies any purulent drainage in his eyes or sinus symptoms. He also describes some penile tumescence which is more prominent in the mornings.  I suspect this may have to do with testosterone levels.  He does have some nocturia and dribbling which probably related to enlarged prostate as well.   Review of Systems:  Review of Systems  Constitutional: Negative.   HENT: Negative.    Eyes:  Positive for discharge.  Respiratory: Negative.    Cardiovascular: Negative.   Genitourinary:  Positive for frequency.  Musculoskeletal: Negative.   Psychiatric/Behavioral:  The patient is nervous/anxious.   All other systems reviewed and are negative.   Past Medical History:  Diagnosis Date   Allergy    Anemia    Anxiety    Depression    Diabetes mellitus without complication ([place])    GERD (gastroesophageal reflux disease)    Hyperlipidemia    Hypertension    Sleep apnea    Past Surgical History:  Procedure Laterality Date   APPENDECTOMY  1952   ENTEROSCOPY N/A 11/16/2021   Procedure: ENTEROSCOPY;  Surgeon: Daryel November, MD;  Location: [facility];  Service: Gastroenterology;  Laterality: N/A;   HEMOSTASIS CLIP PLACEMENT  11/16/2021   Procedure: HEMOSTASIS  CLIP PLACEMENT;  Surgeon: Daryel November, MD;  Location: [facility];  Service: Gastroenterology;;   HOT HEMOSTASIS N/A 11/16/2021   Procedure: HOT HEMOSTASIS (ARGON PLASMA COAGULATION/BICAP);  Surgeon: Daryel November, MD;  Location: [facility];  Service: Gastroenterology;  Laterality: N/A;   LUMBAR DISC SURGERY     POLYPECTOMY  11/16/2021   Procedure: POLYPECTOMY;  Surgeon: Daryel November, MD;  Location: [facility];  Service: Gastroenterology;;   Lia Foyer LIFTING INJECTION  11/16/2021   Procedure: SUBMUCOSAL LIFTING INJECTION;  Surgeon: Daryel November, MD;  Location: [facility];  Service: Gastroenterology;;   Social History:   reports that he has never smoked. He has never been exposed to tobacco smoke. He has never used smokeless tobacco. He reports current alcohol use of about 1.0 standard drink of alcohol per week. He reports that he does not use drugs.  Family History  Problem Relation Age of Onset   Heart failure Mother    Hypertension Mother    Hyperlipidemia Mother    Heart disease Father    Hypertension Father    Hyperlipidemia Father    Sleep apnea Sister    COPD Sister    Colon cancer Neg Hx    Esophageal cancer Neg Hx    Stomach cancer Neg Hx    Rectal cancer Neg Hx     Medications: Patient's Medications  New Prescriptions   No medications on file  Previous Medications   CYANOCOBALAMIN (VITAMIN  B 12 PO)    Take 1,000 mcg by mouth.   FERROUS SULFATE 325 (65 FE) MG EC TABLET    Take 325 mg by mouth daily.   MECLIZINE (ANTIVERT) 12.5 MG TABLET    Take 1 tablet (12.5 mg total) by mouth 3 (three) times daily as needed for dizziness.   METFORMIN (GLUCOPHAGE) 1000 MG TABLET    TAKE 1 TABLET BY MOUTH TWICE DAILY   PANTOPRAZOLE (PROTONIX) 40 MG TABLET    Take 1 tablet (40 mg total) by mouth 2 (two) times daily.   RAMIPRIL (ALTACE) 5 MG CAPSULE    TAKE 1 CAPSULE(5 MG) BY MOUTH DAILY   ROSUVASTATIN (CRESTOR) 10 MG TABLET    Take 1 tablet (10 mg total) by  mouth daily.   SERTRALINE (ZOLOFT) 50 MG TABLET    Take 1.5 tablets (75 mg total) by mouth daily.   TAMSULOSIN (FLOMAX) 0.4 MG CAPS CAPSULE    Take 1 capsule (0.4 mg total) by mouth at bedtime.  Modified Medications   No medications on file  Discontinued Medications   No medications on file    Physical Exam:  Vitals:   07/04/22 1343  BP: 116/70  Pulse: 69  Temp: (!) 96.8 F (36 C)  SpO2: 94%  Weight: 194 lb (88 kg)  Height: '5\' 11"'$  (1.803 m)   Body mass index is 27.06 kg/m. Wt Readings from Last 3 Encounters:  07/04/22 194 lb (88 kg)  03/06/22 189 lb (85.7 kg)  11/16/21 188 lb 15 oz (85.7 kg)    Physical Exam Vitals and nursing note reviewed.  Constitutional:      Appearance: Normal appearance.  HENT:     Right Ear: Tympanic membrane normal.     Left Ear: Tympanic membrane normal.     Nose: Nose normal.  Eyes:     Extraocular Movements: Extraocular movements intact.     Conjunctiva/sclera: Conjunctivae normal.     Pupils: Pupils are equal, round, and reactive to light.  Neurological:     General: No focal deficit present.     Mental Status: He is alert and oriented to person, place, and time.  Psychiatric:        Behavior: Behavior normal.     Comments: I get feeling mood is still a little hostile and not very trusting     Labs reviewed: Basic Metabolic Panel: Recent Labs    07/17/21 0745  NA 138  K 5.2  CL 102  CO2 27  GLUCOSE 110*  BUN 14  CREATININE 1.24  CALCIUM 10.3   Liver Function Tests: Recent Labs    07/17/21 0745  AST 15  ALT 9  BILITOT 0.4  PROT 6.9   No results for input(s): "LIPASE", "AMYLASE" in the last 8760 hours. No results for input(s): "AMMONIA" in the last 8760 hours. CBC: Recent Labs    07/17/21 0745  WBC 8.6  NEUTROABS 5,151  HGB 10.3*  HCT 36.1*  MCV 71.1*  PLT 359   Lipid Panel: No results for input(s): "CHOL", "HDL", "LDLCALC", "TRIG", "CHOLHDL", "LDLDIRECT" in the last 8760 hours. TSH: No results for  input(s): "TSH" in the last 8760 hours. A1C: Lab Results  Component Value Date   HGBA1C 7.0 (H) 11/01/2021     Assessment/Plan  1. Type 2 diabetes mellitus with other specified complication, without long-term current use of insulin (HCC) A1c was good in May at 7.0  2. Iron deficiency anemia, unspecified iron deficiency anemia type Most recent hemoglobin was 10.31-year ago.  He continues to take what he says are B vitamins rather than iron    4. Primary hypertension Medication includes ramipril for blood pressure as well as renal protection blood pressure is good at 116/70  5. Depression, unspecified depression type I think he continues to take sertraline but I am not at all certain mood still seems a little suspicious of what I tell him   Alain Honey, MD [facility] [phone]

## 2022-07-05 LAB — CBC WITH DIFFERENTIAL/PLATELET
Absolute Monocytes: 686 cells/uL (ref 200–950)
Basophils Absolute: 103 cells/uL (ref 0–200)
Basophils Relative: 1.1 %
Eosinophils Absolute: 639 cells/uL — ABNORMAL HIGH (ref 15–500)
Eosinophils Relative: 6.8 %
HCT: 42.5 % (ref 38.5–50.0)
Hemoglobin: 14.1 g/dL (ref 13.2–17.1)
Lymphs Abs: 1673 cells/uL (ref 850–3900)
MCH: 28.3 pg (ref 27.0–33.0)
MCHC: 33.2 g/dL (ref 32.0–36.0)
MCV: 85.3 fL (ref 80.0–100.0)
MPV: 10.6 fL (ref 7.5–12.5)
Monocytes Relative: 7.3 %
Neutro Abs: 6298 cells/uL (ref 1500–7800)
Neutrophils Relative %: 67 %
Platelets: 250 10*3/uL (ref 140–400)
RBC: 4.98 10*6/uL (ref 4.20–5.80)
RDW: 14.1 % (ref 11.0–15.0)
Total Lymphocyte: 17.8 %
WBC: 9.4 10*3/uL (ref 3.8–10.8)

## 2022-07-05 LAB — HEMOGLOBIN A1C
Hgb A1c MFr Bld: 7.1 % of total Hgb — ABNORMAL HIGH (ref <5.7)
Mean Plasma Glucose: 157 mg/dL
eAG (mmol/L): 8.7 mmol/L

## 2022-07-31 ENCOUNTER — Other Ambulatory Visit: Payer: Self-pay

## 2022-07-31 DIAGNOSIS — E785 Hyperlipidemia, unspecified: Secondary | ICD-10-CM

## 2022-07-31 MED ORDER — ROSUVASTATIN CALCIUM 10 MG PO TABS: 10.0000 mg | ORAL_TABLET | Freq: Every day | ORAL | 1 refills | Status: DC

## 2022-09-03 ENCOUNTER — Other Ambulatory Visit: Payer: Self-pay

## 2022-09-03 MED ORDER — METFORMIN HCL 1000 MG PO TABS: 1000.0000 mg | ORAL_TABLET | Freq: Two times a day (BID) | ORAL | 1 refills | Status: DC

## 2022-09-03 NOTE — Telephone Encounter (Signed)
Patient came in office requesting refill on medication.

## 2022-09-24 ENCOUNTER — Ambulatory Visit (INDEPENDENT_AMBULATORY_CARE_PROVIDER_SITE_OTHER): Payer: Medicare Other | Admitting: Nurse Practitioner

## 2022-09-24 ENCOUNTER — Encounter: Payer: Self-pay | Admitting: Nurse Practitioner

## 2022-09-24 VITALS — BP 118/82 | HR 84 | Temp 97.6°F | Ht 71.0 in | Wt 189.6 lb

## 2022-09-24 DIAGNOSIS — E119 Type 2 diabetes mellitus without complications: Secondary | ICD-10-CM

## 2022-09-24 DIAGNOSIS — R319 Hematuria, unspecified: Secondary | ICD-10-CM

## 2022-09-24 DIAGNOSIS — R350 Frequency of micturition: Secondary | ICD-10-CM

## 2022-09-24 DIAGNOSIS — I10 Essential (primary) hypertension: Secondary | ICD-10-CM

## 2022-09-24 NOTE — Progress Notes (Signed)
Location:   [facility]   Place of Service:   [facility] clinic  Provider: Chipper Oman NP  Code Status: DNR Goals of Care:     03/06/2022    1:09 PM  Advanced Directives  Does Patient Have a Medical Advance Directive? No     Chief Complaint  Patient presents with  . Acute Visit    Patient presents today for blood in urine and a urology referral.    HPI: Patient is a 77 y.o. male seen today for c/o of urinary frequency, incontinency, blood in urine, denied burning sensation on urinary, lower abd pain or discomfort, fever, or nausea, vomiting. On Tamsulosin and 09/22/22 started Bactrim DS per [facility] for LUTS  Hx of GERD, T2DM, HTN   Past Medical History:  Diagnosis Date  . Allergy   . Anemia   . Anxiety   . Depression   . Diabetes mellitus without complication   . GERD (gastroesophageal reflux disease)   . Hyperlipidemia   . Hypertension   . Sleep apnea     Past Surgical History:  Procedure Laterality Date  . APPENDECTOMY  1952  . ENTEROSCOPY N/A 11/16/2021   Procedure: ENTEROSCOPY;  Surgeon: Jenel Lucks, MD;  Location: [facility];  Service: Gastroenterology;  Laterality: N/A;  . HEMOSTASIS CLIP PLACEMENT  11/16/2021   Procedure: HEMOSTASIS CLIP PLACEMENT;  Surgeon: Jenel Lucks, MD;  Location: [facility];  Service: Gastroenterology;;  . HOT HEMOSTASIS N/A 11/16/2021   Procedure: HOT HEMOSTASIS (ARGON PLASMA COAGULATION/BICAP);  Surgeon: Jenel Lucks, MD;  Location: [facility];  Service: Gastroenterology;  Laterality: N/A;  . LUMBAR DISC SURGERY    . POLYPECTOMY  11/16/2021   Procedure: POLYPECTOMY;  Surgeon: Jenel Lucks, MD;  Location: [facility];  Service: Gastroenterology;;  . Brooke Dare INJECTION  11/16/2021   Procedure: SUBMUCOSAL LIFTING INJECTION;  Surgeon: Jenel Lucks, MD;  Location: [facility];  Service: Gastroenterology;;    No Known Allergies  Allergies as of 09/24/2022   No Known Allergies       Medication List        Accurate as of September 24, 2022  4:31 PM. If you have any questions, ask your nurse or doctor.          Bactrim DS 800-160 MG tablet Generic drug: sulfamethoxazole-trimethoprim Take 1 tablet by mouth 2 (two) times daily.   ferrous sulfate 325 (65 FE) MG EC tablet Take 325 mg by mouth daily.   meclizine 12.5 MG tablet Commonly known as: ANTIVERT Take 1 tablet (12.5 mg total) by mouth 3 (three) times daily as needed for dizziness.   metFORMIN 1000 MG tablet Commonly known as: GLUCOPHAGE Take 1 tablet (1,000 mg total) by mouth 2 (two) times daily.   NexIUM 40 MG capsule Generic drug: esomeprazole Take 40 mg by mouth daily at 12 noon.   pantoprazole 40 MG tablet Commonly known as: PROTONIX Take 1 tablet (40 mg total) by mouth 2 (two) times daily.   ramipril 5 MG capsule Commonly known as: ALTACE TAKE 1 CAPSULE(5 MG) BY MOUTH DAILY   rosuvastatin 10 MG tablet Commonly known as: Crestor Take 1 tablet (10 mg total) by mouth daily.   sertraline 50 MG tablet Commonly known as: ZOLOFT Take 1.5 tablets (75 mg total) by mouth daily.   tamsulosin 0.4 MG Caps capsule Commonly known as: FLOMAX Take 1 capsule (0.4 mg total) by mouth at bedtime.   VITAMIN B 12 PO Take 1,000 mcg by mouth.  Review of Systems:  Review of Systems  Constitutional:  Negative for appetite change, fatigue and fever.  HENT:  Negative for trouble swallowing.   Eyes:  Negative for visual disturbance.  Respiratory:  Negative for cough, choking and shortness of breath.   Cardiovascular:  Negative for chest pain, palpitations and leg swelling.  Gastrointestinal:  Negative for abdominal pain, constipation, nausea and vomiting.  Genitourinary:  Positive for frequency, hematuria and urgency. Negative for difficulty urinating and dysuria.       Urinary incontinency  Musculoskeletal:  Negative for gait problem.  Skin:  Negative for color change.  Neurological:  Negative  for speech difficulty and headaches.       Memory lapses.   Psychiatric/Behavioral:  Positive for sleep disturbance. Negative for behavioral problems. The patient is not nervous/anxious.        Frequent urination at night.     Health Maintenance  Topic Date Due  . Medicare Annual Wellness (AWV)  Never done  . FOOT EXAM  Never done  . OPHTHALMOLOGY EXAM  Never done  . Diabetic kidney evaluation - Urine ACR  Never done  . Hepatitis C Screening  Never done  . DTaP/Tdap/Td (1 - Tdap) Never done  . Zoster Vaccines- Shingrix (1 of 2) Never done  . Pneumonia Vaccine 5+ Years old (1 of 1 - PCV) Never done  . COVID-19 Vaccine (7 - 2023-24 season) 02/09/2022  . Diabetic kidney evaluation - eGFR measurement  07/17/2022  . HEMOGLOBIN A1C  01/02/2023  . INFLUENZA VACCINE  01/10/2023  . COLONOSCOPY (Pts 45-63yrs Insurance coverage will need to be confirmed)  10/25/2024  . HPV VACCINES  Aged Out    Physical Exam: Vitals:   09/24/22 1441  BP: 118/82  Pulse: 84  Temp: 97.6 F (36.4 C)  SpO2: 96%  Weight: 189 lb 9.6 oz (86 kg)  Height: 5\' 11"  (1.803 m)   Body mass index is 26.44 kg/m. Physical Exam Constitutional:      Appearance: Normal appearance.  HENT:     Head: Normocephalic and atraumatic.     Nose: Nose normal.     Mouth/Throat:     Mouth: Mucous membranes are moist.  Eyes:     Extraocular Movements: Extraocular movements intact.     Conjunctiva/sclera: Conjunctivae normal.     Pupils: Pupils are equal, round, and reactive to light.  Cardiovascular:     Rate and Rhythm: Normal rate and regular rhythm.     Heart sounds: No murmur heard. Pulmonary:     Effort: Pulmonary effort is normal.     Breath sounds: No rales.  Abdominal:     Palpations: Abdomen is soft.     Tenderness: There is no abdominal tenderness. There is no right CVA tenderness, left CVA tenderness, guarding or rebound.  Musculoskeletal:     Cervical back: Normal range of motion and neck supple.     Right  lower leg: No edema.     Left lower leg: No edema.  Skin:    General: Skin is warm and dry.  Neurological:     General: No focal deficit present.     Mental Status: He is alert and oriented to person, place, and time. Mental status is at baseline.     Gait: Gait normal.  Psychiatric:        Mood and Affect: Mood normal.        Behavior: Behavior normal.        Thought Content: Thought content normal.  Judgment: Judgment normal.    Labs reviewed: Basic Metabolic Panel: No results for input(s): "NA", "K", "CL", "CO2", "GLUCOSE", "BUN", "CREATININE", "CALCIUM", "MG", "PHOS", "TSH" in the last 8760 hours. Liver Function Tests: No results for input(s): "AST", "ALT", "ALKPHOS", "BILITOT", "PROT", "ALBUMIN" in the last 8760 hours. No results for input(s): "LIPASE", "AMYLASE" in the last 8760 hours. No results for input(s): "AMMONIA" in the last 8760 hours. CBC: Recent Labs    07/04/22 1428  WBC 9.4  NEUTROABS 6,298  HGB 14.1  HCT 42.5  MCV 85.3  PLT 250   Lipid Panel: No results for input(s): "CHOL", "HDL", "LDLCALC", "TRIG", "CHOLHDL", "LDLDIRECT" in the last 8760 hours. Lab Results  Component Value Date   HGBA1C 7.1 (H) 07/04/2022    Procedures since last visit: No results found.  Assessment/Plan  Urinary frequency No dysuria, blood in urine, or urinary retention, on Tamsulosin, will refer to Urology for evaluation, treatment recommendation.   Hypertension Blood pressure is controlled, taking Ramipril.  Diabetes mellitus without complication (HCC) Controlled, last Hgb A1c 7.1 07/04/22, continue Metformin  Labs/tests ordered:  none  Next appt:  01/02/2023

## 2022-09-24 NOTE — Assessment & Plan Note (Signed)
Blood pressure is controlled, taking Ramipril.

## 2022-09-24 NOTE — Assessment & Plan Note (Signed)
No dysuria, blood in urine, or urinary retention, on Tamsulosin, will refer to Urology for evaluation, treatment recommendation.

## 2022-09-24 NOTE — Assessment & Plan Note (Signed)
Controlled, last Hgb A1c 7.1 07/04/22, continue Metformin

## 2022-09-25 ENCOUNTER — Encounter: Payer: Self-pay | Admitting: Nurse Practitioner

## 2022-10-29 ENCOUNTER — Other Ambulatory Visit: Payer: Self-pay

## 2022-10-29 DIAGNOSIS — I10 Essential (primary) hypertension: Secondary | ICD-10-CM

## 2022-10-29 DIAGNOSIS — K219 Gastro-esophageal reflux disease without esophagitis: Secondary | ICD-10-CM

## 2022-10-29 MED ORDER — PANTOPRAZOLE SODIUM 40 MG PO TBEC: 40.0000 mg | DELAYED_RELEASE_TABLET | Freq: Two times a day (BID) | ORAL | 1 refills | Status: DC

## 2022-10-29 MED ORDER — RAMIPRIL 5 MG PO CAPS: ORAL_CAPSULE | ORAL | 1 refills | Status: DC

## 2022-10-29 NOTE — Telephone Encounter (Signed)
Patient walked in office requesting refills on medications.

## 2022-12-11 ENCOUNTER — Telehealth: Payer: Self-pay

## 2022-12-11 NOTE — Telephone Encounter (Signed)
Fax received from [facility] for surgical clearance. Patient has upcoming appointment on 01/02/23 with Dr. Vela Prose I called to see if patient would like to be seen earlier by a different provider. Left message for patient to call office.

## 2022-12-19 ENCOUNTER — Ambulatory Visit (INDEPENDENT_AMBULATORY_CARE_PROVIDER_SITE_OTHER): Payer: Medicare Other | Admitting: Family

## 2022-12-19 VITALS — BP 100/60 | HR 78 | Temp 97.3°F | Resp 18 | Ht 71.0 in | Wt 189.6 lb

## 2022-12-19 DIAGNOSIS — E1169 Type 2 diabetes mellitus with other specified complication: Secondary | ICD-10-CM | POA: Diagnosis not present

## 2022-12-19 DIAGNOSIS — I10 Essential (primary) hypertension: Secondary | ICD-10-CM

## 2022-12-19 DIAGNOSIS — Z01818 Encounter for other preprocedural examination: Secondary | ICD-10-CM | POA: Diagnosis not present

## 2022-12-19 NOTE — Progress Notes (Signed)
Provider: Johnette Teigen FNP-C  Frederica Kuster, MD  Patient Care Team: Frederica Kuster, MD as PCP - General (Family Medicine)  Extended Emergency Contact Information Primary Emergency Contact: Miron,Jean Mobile Phone: [phone] Relation: Spouse Preferred language: English Interpreter needed? No  Code Status:  Full Code  Goals of care: Advanced Directive information    03/06/2022    1:09 PM  Advanced Directives  Does Patient Have a Medical Advance Directive? No     Chief Complaint  Patient presents with   Pre-op Exam    Patient is here for surgical clearance    HPI:  Pt is a 77 y.o. male seen today for an acute visit for surgical clearance for cystoscopy Transurethral resection of bladder Tumor to be done at [facility] Urology specialist in [place] by Dr.Christopher Winter. He is not on Asprin or anticoagulant.  He denies any acute issues.Usually follows up with PCP Dr.Stephen Hyacinth Meeker.   Past Medical History:  Diagnosis Date   Allergy    Anemia    Anxiety    Depression    Diabetes mellitus without complication (HCC)    GERD (gastroesophageal reflux disease)    Hyperlipidemia    Hypertension    Sleep apnea    Past Surgical History:  Procedure Laterality Date   APPENDECTOMY  1952   ENTEROSCOPY N/A 11/16/2021   Procedure: ENTEROSCOPY;  Surgeon: Jenel Lucks, MD;  Location: [facility];  Service: Gastroenterology;  Laterality: N/A;   HEMOSTASIS CLIP PLACEMENT  11/16/2021   Procedure: HEMOSTASIS CLIP PLACEMENT;  Surgeon: Jenel Lucks, MD;  Location: [facility];  Service: Gastroenterology;;   HOT HEMOSTASIS N/A 11/16/2021   Procedure: HOT HEMOSTASIS (ARGON PLASMA COAGULATION/BICAP);  Surgeon: Jenel Lucks, MD;  Location: [facility];  Service: Gastroenterology;  Laterality: N/A;   LUMBAR DISC SURGERY     POLYPECTOMY  11/16/2021   Procedure: POLYPECTOMY;  Surgeon: Jenel Lucks, MD;  Location: [facility];  Service:  Gastroenterology;;   SUBMUCOSAL LIFTING INJECTION  11/16/2021   Procedure: SUBMUCOSAL LIFTING INJECTION;  Surgeon: Jenel Lucks, MD;  Location: [facility];  Service: Gastroenterology;;    No Known Allergies  Outpatient Encounter Medications as of 12/19/2022  Medication Sig   BACTRIM DS 800-160 MG tablet Take 1 tablet by mouth 2 (two) times daily.   ferrous sulfate 325 (65 FE) MG EC tablet Take 325 mg by mouth daily.   metFORMIN (GLUCOPHAGE) 1000 MG tablet Take 1 tablet (1,000 mg total) by mouth 2 (two) times daily.   pantoprazole (PROTONIX) 40 MG tablet Take 1 tablet (40 mg total) by mouth 2 (two) times daily.   ramipril (ALTACE) 5 MG capsule TAKE 1 CAPSULE(5 MG) BY MOUTH DAILY   rosuvastatin (CRESTOR) 10 MG tablet Take 1 tablet (10 mg total) by mouth daily.   sertraline (ZOLOFT) 50 MG tablet Take 1.5 tablets (75 mg total) by mouth daily.   tamsulosin (FLOMAX) 0.4 MG CAPS capsule Take 1 capsule (0.4 mg total) by mouth at bedtime.   [DISCONTINUED] Cyanocobalamin (VITAMIN B 12 PO) Take 1,000 mcg by mouth.   [DISCONTINUED] esomeprazole (NEXIUM) 40 MG capsule Take 40 mg by mouth daily at 12 noon.   [DISCONTINUED] meclizine (ANTIVERT) 12.5 MG tablet Take 1 tablet (12.5 mg total) by mouth 3 (three) times daily as needed for dizziness.   No facility-administered encounter medications on file as of 12/19/2022.    Review of Systems  Constitutional:  Negative for appetite change, chills, fatigue, fever and unexpected weight change.  HENT:  Negative for congestion, dental problem, ear discharge, ear pain, facial swelling, hearing loss, nosebleeds, postnasal drip, rhinorrhea, sinus pressure, sinus pain, sneezing, sore throat, tinnitus and trouble swallowing.   Eyes:  Negative for pain, discharge, redness, itching and visual disturbance.  Respiratory:  Negative for cough, chest tightness, shortness of breath and wheezing.   Cardiovascular:  Negative for chest pain, palpitations and leg swelling.   Gastrointestinal:  Negative for abdominal distention, abdominal pain, blood in stool, constipation, diarrhea, nausea and vomiting.  Endocrine: Negative for cold intolerance, heat intolerance, polydipsia, polyphagia and polyuria.  Genitourinary:  Negative for difficulty urinating, dysuria, flank pain, frequency and urgency.  Musculoskeletal:  Negative for arthralgias, back pain, gait problem, joint swelling, myalgias, neck pain and neck stiffness.  Skin:  Negative for color change, pallor, rash and wound.  Neurological:  Negative for dizziness, syncope, speech difficulty, weakness, light-headedness, numbness and headaches.  Hematological:  Does not bruise/bleed easily.  Psychiatric/Behavioral:  Negative for agitation, behavioral problems, confusion, hallucinations, self-injury, sleep disturbance and suicidal ideas. The patient is not nervous/anxious.     Immunization History  Administered Date(s) Administered   Influenza-Unspecified 06/11/2020   MODERNA COVID-19 SARS-COV-2 PEDS BIVALENT BOOSTER 6Y-11Y 07/23/2019   Moderna Covid-19 Vaccine Bivalent Booster 4yrs & up 06/25/2019, 07/25/2019, 04/18/2020   Moderna SARS-COV2 Booster Vaccination 02/21/2020, 10/21/2020   Pertinent  Health Maintenance Due  Topic Date Due   FOOT EXAM  Never done   OPHTHALMOLOGY EXAM  Never done   HEMOGLOBIN A1C  01/02/2023   INFLUENZA VACCINE  01/10/2023   Colonoscopy  10/25/2024      05/17/2021    4:30 PM 07/19/2021    3:39 PM 10/04/2021   10:27 AM 11/01/2021    9:39 AM 11/16/2021   10:04 AM  Fall Risk  Falls in the past year? 0 0 0 0   Was there an injury with Fall? 0 0 0 0   Fall Risk Category Calculator 0 0 0 0   Fall Risk Category (Retired) Low Low Low Low   (RETIRED) Patient Fall Risk Level Low fall risk Low fall risk Low fall risk Low fall risk Moderate fall risk  Patient at Risk for Falls Due to No Fall Risks No Fall Risks No Fall Risks No Fall Risks   Fall risk Follow up Falls evaluation completed  Falls evaluation completed Falls evaluation completed Falls evaluation completed    Functional Status Survey:    Vitals:   12/19/22 1328 12/19/22 1341  BP: (!) 100/58 100/60  Pulse: 78   Resp: 18   Temp: (!) 97.3 F (36.3 C)   SpO2: 94%   Weight: 189 lb 9.6 oz (86 kg)   Height: 5\' 11"  (1.803 m)    Body mass index is 26.44 kg/m. Physical Exam Vitals reviewed.  Constitutional:      General: He is not in acute distress.    Appearance: Normal appearance. He is overweight. He is not ill-appearing or diaphoretic.  HENT:     Head: Normocephalic.     Right Ear: Tympanic membrane, ear canal and external ear normal. There is no impacted cerumen.     Left Ear: Tympanic membrane, ear canal and external ear normal. There is no impacted cerumen.     Nose: Nose normal. No congestion or rhinorrhea.     Mouth/Throat:     Mouth: Mucous membranes are moist.     Pharynx: Oropharynx is clear. No oropharyngeal exudate or posterior oropharyngeal erythema.  Eyes:     General: No scleral icterus.  Right eye: No discharge.        Left eye: No discharge.     Extraocular Movements: Extraocular movements intact.     Conjunctiva/sclera: Conjunctivae normal.     Pupils: Pupils are equal, round, and reactive to light.  Neck:     Vascular: No carotid bruit.  Cardiovascular:     Rate and Rhythm: Normal rate and regular rhythm.     Pulses: Normal pulses.     Heart sounds: Normal heart sounds. No murmur heard.    No friction rub. No gallop.  Pulmonary:     Effort: Pulmonary effort is normal. No respiratory distress.     Breath sounds: Normal breath sounds. No wheezing, rhonchi or rales.  Chest:     Chest wall: No tenderness.  Abdominal:     General: Bowel sounds are normal. There is no distension.     Palpations: Abdomen is soft. There is no mass.     Tenderness: There is no abdominal tenderness. There is no right CVA tenderness, left CVA tenderness, guarding or rebound.  Musculoskeletal:         General: No swelling or tenderness. Normal range of motion.     Cervical back: Normal range of motion. No rigidity or tenderness.     Right lower leg: No edema.     Left lower leg: No edema.  Lymphadenopathy:     Cervical: No cervical adenopathy.  Skin:    General: Skin is warm and dry.     Coloration: Skin is not pale.     Findings: No bruising, erythema, lesion or rash.  Neurological:     Mental Status: He is alert and oriented to person, place, and time.     Cranial Nerves: No cranial nerve deficit.     Sensory: No sensory deficit.     Motor: No weakness.     Coordination: Coordination normal.     Gait: Gait normal.  Psychiatric:        Mood and Affect: Mood normal.        Speech: Speech normal.        Behavior: Behavior normal.        Thought Content: Thought content normal.        Judgment: Judgment normal.    Labs reviewed: No results for input(s): "NA", "K", "CL", "CO2", "GLUCOSE", "BUN", "CREATININE", "CALCIUM", "MG", "PHOS" in the last 8760 hours. No results for input(s): "AST", "ALT", "ALKPHOS", "BILITOT", "PROT", "ALBUMIN" in the last 8760 hours. Recent Labs    07/04/22 1428  WBC 9.4  NEUTROABS 6,298  HGB 14.1  HCT 42.5  MCV 85.3  PLT 250   Lab Results  Component Value Date   TSH 1.17 11/18/2020   Lab Results  Component Value Date   HGBA1C 7.1 (H) 07/04/2022   Lab Results  Component Value Date   CHOL 138 04/26/2021   HDL 56 04/26/2021   LDLCALC 61 04/26/2021   TRIG 119 04/26/2021   CHOLHDL 2.5 04/26/2021    Significant Diagnostic Results in last 30 days:  No results found.  Assessment/Plan 1. Pre-operative clearance Has upcoming cystoscopy Transurethral resection of bladder Tumor to be done at [facility] Urology specialist in [place] by Dr.Christopher Winter. Patient reports surgery date will be set upon clearance with PCP - EKG 12-Lead indicates sinus rhythm with rightward P-axis and rotation possible pulmonary disease heart rate 68 b/min  no previous EKG for comparison. - CBC with Differential/Platelet - Basic metabolic panel  2. Primary hypertension B/p stable  -  Continue on ramipril - CBC with Differential/Platelet - Basic metabolic panel  3. Type 2 diabetes mellitus with other specified complication, without long-term current use of insulin (HCC) Lab Results  Component Value Date   HGBA1C 7.1 (H) 07/04/2022  -No home CBG for review but does not check blood sugars at home. Continue on metformin  - continue on ramipril  Family/ staff Communication: Reviewed plan of care with patient verbalized understanding.   Labs/tests ordered:  - CBC with Differential/Platelet - Basic metabolic panel  Next Appointment: Return if symptoms worsen or fail to improve.   Caesar Bookman, NP

## 2022-12-20 LAB — CBC WITH DIFFERENTIAL/PLATELET
Absolute Monocytes: 689 cells/uL (ref 200–950)
Basophils Absolute: 102 cells/uL (ref 0–200)
Basophils Relative: 1.2 %
Eosinophils Absolute: 663 cells/uL — ABNORMAL HIGH (ref 15–500)
Eosinophils Relative: 7.8 %
HCT: 42.9 % (ref 38.5–50.0)
Hemoglobin: 14.1 g/dL (ref 13.2–17.1)
Lymphs Abs: 1675 cells/uL (ref 850–3900)
MCH: 29.6 pg (ref 27.0–33.0)
MCHC: 32.9 g/dL (ref 32.0–36.0)
MCV: 89.9 fL (ref 80.0–100.0)
MPV: 10 fL (ref 7.5–12.5)
Monocytes Relative: 8.1 %
Neutro Abs: 5372 cells/uL (ref 1500–7800)
Neutrophils Relative %: 63.2 %
Platelets: 233 10*3/uL (ref 140–400)
RBC: 4.77 10*6/uL (ref 4.20–5.80)
RDW: 12.5 % (ref 11.0–15.0)
Total Lymphocyte: 19.7 %
WBC: 8.5 10*3/uL (ref 3.8–10.8)

## 2022-12-20 LAB — BASIC METABOLIC PANEL
BUN/Creatinine Ratio: 12 (calc) (ref 6–22)
BUN: 18 mg/dL (ref 7–25)
CO2: 25 mmol/L (ref 20–32)
Calcium: 10.9 mg/dL — ABNORMAL HIGH (ref 8.6–10.3)
Chloride: 104 mmol/L (ref 98–110)
Creat: 1.47 mg/dL — ABNORMAL HIGH (ref 0.70–1.28)
Glucose, Bld: 149 mg/dL — ABNORMAL HIGH (ref 65–99)
Potassium: 5 mmol/L (ref 3.5–5.3)
Sodium: 139 mmol/L (ref 135–146)

## 2022-12-24 ENCOUNTER — Other Ambulatory Visit: Payer: Self-pay | Admitting: Urology

## 2022-12-28 NOTE — Progress Notes (Signed)
Please place orders for PAT appointment scheduled 12/31/22.

## 2022-12-28 NOTE — Progress Notes (Signed)
COVID Vaccine Completed: yes  Date of COVID positive in last 90 days:  PCP - Jacalyn Lefevre, MD Cardiologist -   Medical clearance by Dinah Ngetich 12/19/22 in Epic  Chest x-ray -  EKG - 12/19/22 Epic Stress Test -  ECHO -  Cardiac Cath -  Pacemaker/ICD device last checked: Spinal Cord Stimulator:  Bowel Prep -   Sleep Study -  CPAP -   Fasting Blood Sugar -  Checks Blood Sugar _____ times a day  Last dose of GLP1 agonist-  N/A GLP1 instructions:  N/A   Last dose of SGLT-2 inhibitors-  N/A SGLT-2 instructions: N/A   Blood Thinner Instructions:  Time Aspirin Instructions: Last Dose:  Activity level:  Can go up a flight of stairs and perform activities of daily living without stopping and without symptoms of chest pain or shortness of breath.  Able to exercise without symptoms  Unable to go up a flight of stairs without symptoms of     Anesthesia review:   Patient denies shortness of breath, fever, cough and chest pain at PAT appointment  Patient verbalized understanding of instructions that were given to them at the PAT appointment. Patient was also instructed that they will need to review over the PAT instructions again at home before surgery.

## 2022-12-28 NOTE — Patient Instructions (Signed)
SURGICAL WAITING ROOM VISITATION  Patients having surgery or a procedure may have no more than 2 support people in the waiting area - these visitors may rotate.    Children under the age of 23 must have an adult with them who is not the patient.  Due to an increase in RSV and influenza rates and associated hospitalizations, children ages 44 and under may not visit patients in [facility] hospitals.  If the patient needs to stay at the hospital during part of their recovery, the visitor guidelines for inpatient rooms apply. Pre-op nurse will coordinate an appropriate time for 1 support person to accompany patient in pre-op.  This support person may not rotate.    Please refer to the [facility] website for the visitor guidelines for Inpatients (after your surgery is over and you are in a regular room).     Your procedure is scheduled on: 01/09/23   Report to [facility] Main Entrance    Report to admitting at 8:15 AM   Call this number if you have problems the morning of surgery [phone]   Do not eat food or drink liquids:After Midnight.          If you have questions, please contact your surgeon's office.   FOLLOW BOWEL PREP AND ANY ADDITIONAL PRE OP INSTRUCTIONS YOU RECEIVED FROM YOUR SURGEON'S OFFICE!!!     Oral Hygiene is also important to reduce your risk of infection.                                    Remember - BRUSH YOUR TEETH THE MORNING OF SURGERY WITH YOUR REGULAR TOOTHPASTE  DENTURES WILL BE REMOVED PRIOR TO SURGERY PLEASE DO NOT APPLY "Poly grip" OR ADHESIVES!!!   Take these medicines the morning of surgery with A SIP OF WATER: Pantoprazole, Rosuvastatin, Sertraline, Tamsulosin  DO NOT TAKE ANY ORAL DIABETIC MEDICATIONS DAY OF YOUR SURGERY  How to Manage Your Diabetes Before and After Surgery  Why is it important to control my blood sugar before and after surgery? Improving blood sugar levels before and after surgery helps healing and can limit  problems. A way of improving blood sugar control is eating a healthy diet by:  Eating less sugar and carbohydrates  Increasing activity/exercise  Talking with your doctor about reaching your blood sugar goals High blood sugars (greater than 180 mg/dL) can raise your risk of infections and slow your recovery, so you will need to focus on controlling your diabetes during the weeks before surgery. Make sure that the doctor who takes care of your diabetes knows about your planned surgery including the date and location.  How do I manage my blood sugar before surgery? Check your blood sugar at least 4 times a day, starting 2 days before surgery, to make sure that the level is not too high or low. Check your blood sugar the morning of your surgery when you wake up and every 2 hours until you get to the Short Stay unit. If your blood sugar is less than 70 mg/dL, you will need to treat for low blood sugar: Do not take insulin. Treat a low blood sugar (less than 70 mg/dL) with  cup of clear juice (cranberry or apple), 4 glucose tablets, OR glucose gel. Recheck blood sugar in 15 minutes after treatment (to make sure it is greater than 70 mg/dL). If your blood sugar is not greater than  70 mg/dL on recheck, call [phone] for further instructions. Report your blood sugar to the short stay nurse when you get to Short Stay.  If you are admitted to the hospital after surgery: Your blood sugar will be checked by the staff and you will probably be given insulin after surgery (instead of oral diabetes medicines) to make sure you have good blood sugar levels. The goal for blood sugar control after surgery is 80-180 mg/dL.   WHAT DO I DO ABOUT MY DIABETES MEDICATION?  Do not take oral diabetes medicines (pills) the morning of surgery.  THE DAY BEFORE SURGERY, take Metformin as prescribed.      THE MORNING OF SURGERY, do not take Metformin.  Reviewed and Endorsed by [facility] Patient Education  Committee, August 2015                              You may not have any metal on your body including jewelry, and body piercing             Do not wear lotions, powders, cologne, or deodorant              Men may shave face and neck.   Do not bring valuables to the hospital. [facility] IS NOT             RESPONSIBLE   FOR VALUABLES.   Contacts, glasses, dentures or bridgework may not be worn into surgery.  DO NOT BRING YOUR HOME MEDICATIONS TO THE HOSPITAL. PHARMACY WILL DISPENSE MEDICATIONS LISTED ON YOUR MEDICATION LIST TO YOU DURING YOUR ADMISSION IN THE HOSPITAL!    Patients discharged on the day of surgery will not be allowed to drive home.  Someone NEEDS to stay with you for the first 24 hours after anesthesia.   Special Instructions: Bring a copy of your healthcare power of attorney and living will documents the day of surgery if you haven't scanned them before.              Please read over the following fact sheets you were given: IF YOU HAVE QUESTIONS ABOUT YOUR PRE-OP INSTRUCTIONS PLEASE CALL [phone] Contras    If you received a COVID test during your pre-op visit  it is requested that you wear a mask when out in public, stay away from anyone that may not be feeling well and notify your surgeon if you develop symptoms. If you test positive for Covid or have been in contact with anyone that has tested positive in the last 10 days please notify you surgeon.    [facility] - Preparing for Surgery Before surgery, you can play an important role.  Because skin is not sterile, your skin needs to be as free of germs as possible.  You can reduce the number of germs on your skin by washing with CHG (chlorahexidine gluconate) soap before surgery.  CHG is an antiseptic cleaner which kills germs and bonds with the skin to continue killing germs even after washing. Please DO NOT use if you have an allergy to CHG or antibacterial soaps.  If your skin becomes reddened/irritated stop  using the CHG and inform your nurse when you arrive at Short Stay. Do not shave (including legs and underarms) for at least 48 hours prior to the first CHG shower.  You may shave your face/neck.  Please follow these instructions carefully:  1.  Shower with CHG Soap the night before surgery  and the  morning of surgery.  2.  If you choose to wash your hair, wash your hair first as usual with your normal  shampoo.  3.  After you shampoo, rinse your hair and body thoroughly to remove the shampoo.                             4.  Use CHG as you would any other liquid soap.  You can apply chg directly to the skin and wash.  Gently with a scrungie or clean washcloth.  5.  Apply the CHG Soap to your body ONLY FROM THE NECK DOWN.   Do   not use on face/ open                           Wound or open sores. Avoid contact with eyes, ears mouth and   genitals (private parts).                       Wash face,  Genitals (private parts) with your normal soap.             6.  Wash thoroughly, paying special attention to the area where your    surgery  will be performed.  7.  Thoroughly rinse your body with warm water from the neck down.  8.  DO NOT shower/wash with your normal soap after using and rinsing off the CHG Soap.                9.  Pat yourself dry with a clean towel.            10.  Wear clean pajamas.            11.  Place clean sheets on your bed the night of your first shower and do not  sleep with pets. Day of Surgery : Do not apply any lotions/deodorants the morning of surgery.  Please wear clean clothes to the hospital/surgery center.  FAILURE TO FOLLOW THESE INSTRUCTIONS MAY RESULT IN THE CANCELLATION OF YOUR SURGERY  PATIENT SIGNATURE_________________________________  NURSE SIGNATURE__________________________________  ________________________________________________________________________

## 2022-12-31 ENCOUNTER — Other Ambulatory Visit: Payer: Self-pay

## 2022-12-31 ENCOUNTER — Encounter (HOSPITAL_COMMUNITY): Payer: Self-pay

## 2022-12-31 ENCOUNTER — Encounter (HOSPITAL_COMMUNITY)
Admission: RE | Admit: 2022-12-31 | Discharge: 2022-12-31 | Disposition: A | Discharge status: Home / Self Care | Payer: Medicare Other | Source: Ambulatory Visit | Attending: Urology | Admitting: Urology

## 2022-12-31 VITALS — BP 109/70 | HR 74 | Temp 97.8°F | Resp 14 | Ht 70.0 in | Wt 186.0 lb

## 2022-12-31 DIAGNOSIS — Z01812 Encounter for preprocedural laboratory examination: Secondary | ICD-10-CM | POA: Insufficient documentation

## 2022-12-31 DIAGNOSIS — E119 Type 2 diabetes mellitus without complications: Secondary | ICD-10-CM | POA: Insufficient documentation

## 2022-12-31 LAB — HEMOGLOBIN A1C
Hgb A1c MFr Bld: 6.4 % — ABNORMAL HIGH (ref 4.8–5.6)
Mean Plasma Glucose: 137 mg/dL

## 2022-12-31 LAB — GLUCOSE, CAPILLARY: Glucose-Capillary: 213 mg/dL — ABNORMAL HIGH (ref 70–99)

## 2023-01-02 ENCOUNTER — Ambulatory Visit: Payer: Medicare Other | Admitting: Family Medicine

## 2023-01-03 ENCOUNTER — Encounter: Payer: Self-pay | Admitting: Adult Health

## 2023-01-03 ENCOUNTER — Ambulatory Visit (INDEPENDENT_AMBULATORY_CARE_PROVIDER_SITE_OTHER): Payer: Medicare Other | Admitting: Adult Health

## 2023-01-03 VITALS — BP 122/88 | HR 72 | Temp 97.3°F | Resp 18 | Ht 71.0 in | Wt 188.0 lb

## 2023-01-03 DIAGNOSIS — Z0000 Encounter for general adult medical examination without abnormal findings: Secondary | ICD-10-CM

## 2023-01-03 NOTE — Patient Instructions (Signed)
  Bryan Stafford , Thank you for taking time to come for your Medicare Wellness Visit. I appreciate your ongoing commitment to your health goals. Please review the following plan we discussed and let me know if I can assist you in the future.   These are the goals we discussed:  Goals   None     This is a list of the screening recommended for you and due dates:  Health Maintenance  Topic Date Due   Complete foot exam   Never done   Eye exam for diabetics  Never done   Yearly kidney health urinalysis for diabetes  Never done   Hepatitis C Screening  Never done   DTaP/Tdap/Td vaccine (1 - Tdap) Never done   Zoster (Shingles) Vaccine (1 of 2) Never done   Pneumonia Vaccine (1 of 1 - PCV) Never done   COVID-19 Vaccine (10 - 2023-24 season) 06/12/2023*   Flu Shot  01/10/2023   Hemoglobin A1C  07/03/2023   Yearly kidney function blood test for diabetes  12/19/2023   Medicare Annual Wellness Visit  01/03/2024   Colon Cancer Screening  10/25/2024   HPV Vaccine  Aged Out  *Topic was postponed. The date shown is not the original due date.

## 2023-01-03 NOTE — Progress Notes (Signed)
Subjective:   Bryan Stafford is a 77 y.o. male who presents for Medicare Annual/Subsequent preventive examination.  Visit Complete: In person  Patient Medicare AWV questionnaire was completed by the patient on 01/03/23; I have confirmed that all information answered by patient is correct and no changes since this date.  Review of Systems     Cardiac Risk Factors include: advanced age (>40men, >48 women);diabetes mellitus;dyslipidemia;hypertension;male gender;sedentary lifestyle     Objective:    Today's Vitals   01/03/23 0934 01/03/23 0945  BP: 122/88   Pulse: 72   Resp: 18   Temp: (!) 97.3 F (36.3 C)   SpO2: 93%   Weight: 188 lb (85.3 kg)   Height: 5\' 11"  (1.803 m)   PainSc:  2    Body mass index is 26.22 kg/m.     01/03/2023    9:53 AM 12/31/2022    9:05 AM 03/06/2022    1:09 PM 11/16/2021    9:56 AM 07/19/2021    3:40 PM 05/17/2021    4:30 PM 04/18/2021    3:21 PM  Advanced Directives  Does Patient Have a Medical Advance Directive? No Yes No Yes Yes Yes Yes  Type of Advance Directive  Living will;Healthcare Power of [employer] Power of [place];Living will Healthcare Power of [place];Out of facility DNR (pink MOST or yellow form) Living will;Healthcare Power of Attorney Living will;Healthcare Power of Attorney  Does patient want to make changes to medical advance directive?     No - Patient declined No - Patient declined No - Patient declined  Copy of Healthcare Power of Attorney in Chart?  No - copy requested  No - copy requested No - copy requested No - copy requested No - copy requested  Would patient like information on creating a medical advance directive? No - Patient declined          Current Medications (verified) Outpatient Encounter Medications as of 01/03/2023  Medication Sig   ferrous sulfate 325 (65 FE) MG EC tablet Take 325 mg by mouth daily.   metFORMIN (GLUCOPHAGE) 1000 MG tablet Take 1 tablet (1,000 mg total) by mouth 2 (two) times daily.    pantoprazole (PROTONIX) 40 MG tablet Take 1 tablet (40 mg total) by mouth 2 (two) times daily.   ramipril (ALTACE) 5 MG capsule TAKE 1 CAPSULE(5 MG) BY MOUTH DAILY   rosuvastatin (CRESTOR) 10 MG tablet Take 1 tablet (10 mg total) by mouth daily.   sertraline (ZOLOFT) 50 MG tablet Take 1.5 tablets (75 mg total) by mouth daily. (Patient taking differently: Take 50 mg by mouth daily.)   tamsulosin (FLOMAX) 0.4 MG CAPS capsule Take 1 capsule (0.4 mg total) by mouth at bedtime.   No facility-administered encounter medications on file as of 01/03/2023.    Allergies (verified) Patient has no known allergies.   History: Past Medical History:  Diagnosis Date   Allergy    Anemia    Anxiety    Depression    Diabetes mellitus without complication (HCC)    GERD (gastroesophageal reflux disease)    History of kidney stones 1971   Hyperlipidemia    Hypertension    Sleep apnea    Past Surgical History:  Procedure Laterality Date   APPENDECTOMY  1952   ENTEROSCOPY N/A 11/16/2021   Procedure: ENTEROSCOPY;  Surgeon: Jenel Lucks, MD;  Location: [facility];  Service: Gastroenterology;  Laterality: N/A;   HEMOSTASIS CLIP PLACEMENT  11/16/2021   Procedure: HEMOSTASIS CLIP PLACEMENT;  Surgeon: Tomasa Rand,  Dub Amis, MD;  Location: [facility];  Service: Gastroenterology;;   HOT HEMOSTASIS N/A 11/16/2021   Procedure: HOT HEMOSTASIS (ARGON PLASMA COAGULATION/BICAP);  Surgeon: Jenel Lucks, MD;  Location: [facility];  Service: Gastroenterology;  Laterality: N/A;   LUMBAR DISC SURGERY     POLYPECTOMY  11/16/2021   Procedure: POLYPECTOMY;  Surgeon: Jenel Lucks, MD;  Location: [facility];  Service: Gastroenterology;;   Sunnie Nielsen LIFTING INJECTION  11/16/2021   Procedure: SUBMUCOSAL LIFTING INJECTION;  Surgeon: Jenel Lucks, MD;  Location: [facility];  Service: Gastroenterology;;   Family History  Problem Relation Age of Onset   Heart failure Mother    Hypertension Mother     Hyperlipidemia Mother    Heart disease Father    Hypertension Father    Hyperlipidemia Father    Sleep apnea Sister    COPD Sister    Colon cancer Neg Hx    Esophageal cancer Neg Hx    Stomach cancer Neg Hx    Rectal cancer Neg Hx    Social History   Socioeconomic History   Marital status: Married    Spouse name: Not on file   Number of children: 2   Years of education: Not on file   Highest education level: Master's degree (e.g., MA, MS, MEng, MEd, MSW, MBA)  Occupational History   Occupation: Controller    Comment: Clinical research associate   Occupation: retired  Tobacco Use   Smoking status: Never    Passive exposure: Never   Smokeless tobacco: Never  Vaping Use   Vaping status: Never Used  Substance and Sexual Activity   Alcohol use: Yes    Alcohol/week: 2.0 standard drinks of alcohol    Types: 2 Standard drinks or equivalent per week    Comment: wine occasional   Drug use: Never   Sexual activity: Not on file  Other Topics Concern   Not on file  Social History Narrative   Do you drink/eat things with caffeine:Yes Coffee and Tea   What year were you married? 1973   Do you live in a house, apartment, assisted living, condo, trailer, etc.? Duplex   Is it one or more stories? One   How many persons live in your home? 2- Wife and myself    Do you exercise? No                               Do you have a DNR form? No If not, do you want to discuss one? Yes   Do you have any difficulty bathing or dressing yourself? No   Do you have difficulty preparing food or eating?No   Do you have difficulty managing your medications?No   Do you have any difficulty managing your finances? No   Do you have any difficulty affording your medications? No          Social Determinants of Health   Financial Resource Strain: Low Risk  (12/19/2022)   Overall Financial Resource Strain (CARDIA)    Difficulty of Paying Living Expenses: Not hard at all  Food Insecurity: No Food  Insecurity (01/03/2023)   Hunger Vital Sign    Worried About Running Out of Food in the Last Year: Never true    Ran Out of Food in the Last Year: Never true  Transportation Needs: No Transportation Needs (12/19/2022)   PRAPARE - Administrator, Civil Service (Medical): No  Lack of Transportation (Non-Medical): No  Physical Activity: Unknown (12/19/2022)   Exercise Vital Sign    Days of Exercise per Week: 0 days    Minutes of Exercise per Session: Not on file  Stress: No Stress Concern Present (12/19/2022)   [employer] of Occupational Health - Occupational Stress Questionnaire    Feeling of Stress : Only a little  Social Connections: Socially Integrated (12/19/2022)   Social Connection and Isolation Panel [NHANES]    Frequency of Communication with Friends and Family: Twice a week    Frequency of Social Gatherings with Friends and Family: Once a week    Attends Religious Services: More than 4 times per year    Active Member of [employer] or Organizations: Yes    Attends Engineer, structural: More than 4 times per year    Marital Status: Married    Tobacco Counseling Counseling given: Not Answered   Clinical Intake:  Pre-visit preparation completed: Yes  Pain : 0-10 Pain Score: 2  Pain Type: Chronic pain Pain Location: Back Pain Orientation: Lower Pain Radiating Towards: none Pain Descriptors / Indicators: Discomfort Pain Onset: Other (comment) (over a year) Pain Frequency: Constant Pain Relieving Factors: nothing Effect of Pain on Daily Activities: none  Pain Relieving Factors: nothing  BMI - recorded:  (26.69) Nutritional Status: BMI 25 -29 Overweight Nutritional Risks: None Diabetes: Yes CBG done?: No Did pt. bring in CBG monitor from home?: No  How often do you need to have someone help you when you read instructions, pamphlets, or other written materials from your doctor or pharmacy?: 1 - Never What is the last grade level you completed in  school?: Master's Degree in Accounting  Interpreter Needed?: No  Information entered by :: Marquite Attwood Medina-Vargas DNP   Activities of Daily Living    01/03/2023    9:52 AM 01/02/2023    4:44 PM  In your present state of health, do you have any difficulty performing the following activities:  Hearing? 1 1  Vision? 1 0  Difficulty concentrating or making decisions? 0 1  Walking or climbing stairs? 0 0  Dressing or bathing? 0 0  Doing errands, shopping? 0 0  Preparing Food and eating ? N N  Using the Toilet? N N  In the past six months, have you accidently leaked urine? N Y  Do you have problems with loss of bowel control? N N  Managing your Medications? N N  Managing your Finances? N N  Housekeeping or managing your Housekeeping? N N    Patient Care Team: Frederica Kuster, MD as PCP - General (Family Medicine)  Indicate any recent Medical Services you may have received from other than [facility] providers in the past year (date may be approximate).     Assessment:   This is a routine wellness examination for Bryan Stafford.  Hearing/Vision screen No results found.  Dietary issues and exercise activities discussed:     Goals Addressed   None   Depression Screen    01/03/2023    9:52 AM  PHQ 2/9 Scores  PHQ - 2 Score 0  PHQ- 9 Score 0    Fall Risk    01/03/2023    9:51 AM 01/02/2023    4:44 PM 11/01/2021    9:39 AM 10/04/2021   10:27 AM 07/19/2021    3:39 PM  Fall Risk   Falls in the past year? 0 0 0 0 0  Number falls in past yr: 0 0 0 0 0  Injury with Fall? 0 0 0 0 0  Risk for fall due to : No Fall Risks  No Fall Risks No Fall Risks No Fall Risks  Follow up Falls evaluation completed  Falls evaluation completed Falls evaluation completed Falls evaluation completed    MEDICARE RISK AT HOME:   TIMED UP AND GO:  Was the test performed?  Yes  Length of time to ambulate 10 feet: <2 sec Gait steady and fast without use of assistive device    Cognitive Function:     01/03/2023    9:54 AM  MMSE - Mini Mental State Exam  Orientation to time 5  Orientation to Place 5  Registration 3  Attention/ Calculation 5  Recall 3  Language- name 2 objects 2  Language- repeat 1  Language- follow 3 step command 3  Language- read & follow direction 1  Write a sentence 1  Copy design 1  Total score 30        Immunizations Immunization History  Administered Date(s) Administered   Influenza-Unspecified 06/11/2020   MODERNA COVID-19 SARS-COV-2 PEDS BIVALENT BOOSTER 6yr-21yr 07/23/2019   Moderna Covid-19 Vaccine Bivalent Booster 63yrs & up 06/25/2019, 07/25/2019, 04/18/2020, 01/01/2022, 04/03/2022, 09/19/2022   Moderna SARS-COV2 Booster Vaccination 02/21/2020, 10/21/2020    TDAP status: Due, Education has been provided regarding the importance of this vaccine. Advised may receive this vaccine at local pharmacy or Health Dept. Aware to provide a copy of the vaccination record if obtained from local pharmacy or Health Dept. Verbalized acceptance and understanding.  Flu Vaccine status: Declined, Education has been provided regarding the importance of this vaccine but patient still declined. Advised may receive this vaccine at local pharmacy or Health Dept. Aware to provide a copy of the vaccination record if obtained from local pharmacy or Health Dept. Verbalized acceptance and understanding.  Pneumococcal vaccine status: Declined,  Education has been provided regarding the importance of this vaccine but patient still declined. Advised may receive this vaccine at local pharmacy or Health Dept. Aware to provide a copy of the vaccination record if obtained from local pharmacy or Health Dept. Verbalized acceptance and understanding.   Covid-19 vaccine status: Completed vaccines  Qualifies for Shingles Vaccine? No   Zostavax completed  declined   Shingrix Completed?: No.    Education has been provided regarding the importance of this vaccine. Patient has been advised to  call insurance company to determine out of pocket expense if they have not yet received this vaccine. Advised may also receive vaccine at local pharmacy or Health Dept. Verbalized acceptance and understanding.  Screening Tests Health Maintenance  Topic Date Due   FOOT EXAM  Never done   OPHTHALMOLOGY EXAM  Never done   Diabetic kidney evaluation - Urine ACR  Never done   Hepatitis C Screening  Never done   DTaP/Tdap/Td (1 - Tdap) Never done   Zoster Vaccines- Shingrix (1 of 2) Never done   Pneumonia Vaccine 31+ Years old (1 of 1 - PCV) Never done   COVID-19 Vaccine (10 - 2023-24 season) 06/12/2023 (Originally 11/14/2022)   INFLUENZA VACCINE  01/10/2023   HEMOGLOBIN A1C  07/03/2023   Diabetic kidney evaluation - eGFR measurement  12/19/2023   Medicare Annual Wellness (AWV)  01/03/2024   Colonoscopy  10/25/2024   HPV VACCINES  Aged Out    Health Maintenance  Health Maintenance Due  Topic Date Due   FOOT EXAM  Never done   OPHTHALMOLOGY EXAM  Never done   Diabetic kidney evaluation -  Urine ACR  Never done   Hepatitis C Screening  Never done   DTaP/Tdap/Td (1 - Tdap) Never done   Zoster Vaccines- Shingrix (1 of 2) Never done   Pneumonia Vaccine 47+ Years old (1 of 1 - PCV) Never done    Colorectal cancer screening: Type of screening: Colonoscopy. Completed 10/25/21. Repeat every 5 years  Lung Cancer Screening: (Low Dose CT Chest recommended if Age 9-80 years, 20 pack-year currently smoking OR have quit w/in 15years.) does not qualify.   Lung Cancer Screening Referral: No  Additional Screening:  Hepatitis C Screening: does qualify; Completed will get one next appointment  Vision Screening: Recommended annual ophthalmology exams for early detection of glaucoma and other disorders of the eye. Is the patient up to date with their annual eye exam?  Yes  Who is the provider or what is the name of the office in which the patient attends annual eye exams? Dr. Randon Goldsmith If pt is not  established with a provider, would they like to be referred to a provider to establish care? No .   Dental Screening: Recommended annual dental exams for proper oral hygiene  Diabetic Foot Exam: Diabetic Foot Exam: Completed 01/03/23  Community Resource Referral / Chronic Care Management: CRR required this visit?  No   CCM required this visit?  No     Plan:     I have personally reviewed and noted the following in the patient's chart:   Medical and social history Use of alcohol, tobacco or illicit drugs  Current medications and supplements including opioid prescriptions. Patient is not currently taking opioid prescriptions. Functional ability and status Nutritional status Physical activity Advanced directives List of other physicians Hospitalizations, surgeries, and ER visits in previous 12 months Vitals Screenings to include cognitive, depression, and falls Referrals and appointments  In addition, I have reviewed and discussed with patient certain preventive protocols, quality metrics, and best practice recommendations. A written personalized care plan for preventive services as well as general preventive health recommendations were provided to patient.     Kerilyn Cortner Medina-Vargas, NP   01/03/2023   After Visit Summary: (Pick Up) Due to this being a telephonic visit, with patients personalized plan was offered to patient and patient has requested to Pick up at office.  Nurse Notes:  Needs Hep C screening

## 2023-01-09 ENCOUNTER — Ambulatory Visit (HOSPITAL_COMMUNITY): Payer: Medicare Other | Admitting: Physician Assistant

## 2023-01-09 ENCOUNTER — Ambulatory Visit (HOSPITAL_BASED_OUTPATIENT_CLINIC_OR_DEPARTMENT_OTHER): Payer: Medicare Other | Admitting: Anesthesiology

## 2023-01-09 ENCOUNTER — Encounter (HOSPITAL_COMMUNITY): Payer: Self-pay | Admitting: Urology

## 2023-01-09 ENCOUNTER — Ambulatory Visit (HOSPITAL_COMMUNITY)
Admission: RE | Admit: 2023-01-09 | Discharge: 2023-01-09 | Disposition: A | Discharge status: Home / Self Care | Payer: Medicare Other | Source: Ambulatory Visit | Attending: Urology | Admitting: Urology

## 2023-01-09 ENCOUNTER — Encounter (HOSPITAL_COMMUNITY)
Admission: RE | Disposition: A | Discharge status: Home / Self Care | Payer: Self-pay | Source: Ambulatory Visit | Attending: Urology

## 2023-01-09 DIAGNOSIS — G4730 Sleep apnea, unspecified: Secondary | ICD-10-CM | POA: Insufficient documentation

## 2023-01-09 DIAGNOSIS — R310 Gross hematuria: Secondary | ICD-10-CM | POA: Insufficient documentation

## 2023-01-09 DIAGNOSIS — K219 Gastro-esophageal reflux disease without esophagitis: Secondary | ICD-10-CM | POA: Insufficient documentation

## 2023-01-09 DIAGNOSIS — N471 Phimosis: Secondary | ICD-10-CM | POA: Insufficient documentation

## 2023-01-09 DIAGNOSIS — D649 Anemia, unspecified: Secondary | ICD-10-CM | POA: Insufficient documentation

## 2023-01-09 DIAGNOSIS — D759 Disease of blood and blood-forming organs, unspecified: Secondary | ICD-10-CM | POA: Insufficient documentation

## 2023-01-09 DIAGNOSIS — N1330 Unspecified hydronephrosis: Secondary | ICD-10-CM | POA: Insufficient documentation

## 2023-01-09 DIAGNOSIS — E119 Type 2 diabetes mellitus without complications: Secondary | ICD-10-CM | POA: Diagnosis not present

## 2023-01-09 DIAGNOSIS — D414 Neoplasm of uncertain behavior of bladder: Secondary | ICD-10-CM | POA: Diagnosis not present

## 2023-01-09 DIAGNOSIS — D494 Neoplasm of unspecified behavior of bladder: Secondary | ICD-10-CM | POA: Diagnosis not present

## 2023-01-09 DIAGNOSIS — I10 Essential (primary) hypertension: Secondary | ICD-10-CM | POA: Insufficient documentation

## 2023-01-09 HISTORY — PX: CYSTOSCOPY: SHX5120

## 2023-01-09 LAB — GLUCOSE, CAPILLARY
Glucose-Capillary: 133 mg/dL — ABNORMAL HIGH (ref 70–99)
Glucose-Capillary: 150 mg/dL — ABNORMAL HIGH (ref 70–99)

## 2023-01-09 SURGERY — TURBT, WITH CHEMOTHERAPEUTIC AGENT INSTILLATION INTO BLADDER
Anesthesia: General

## 2023-01-09 MED ORDER — LACTATED RINGERS IV SOLN: INTRAVENOUS | Status: DC

## 2023-01-09 MED ORDER — FENTANYL CITRATE PF 50 MCG/ML IJ SOSY
25.0000 ug | PREFILLED_SYRINGE | INTRAMUSCULAR | Status: DC | PRN
  Administered 2023-01-09: 50 ug via INTRAVENOUS

## 2023-01-09 MED ORDER — DEXAMETHASONE SODIUM PHOSPHATE 10 MG/ML IJ SOLN
INTRAMUSCULAR | Status: AC
  Filled 2023-01-09: qty 1

## 2023-01-09 MED ORDER — SUGAMMADEX SODIUM 200 MG/2ML IV SOLN
INTRAVENOUS | Status: DC | PRN
  Administered 2023-01-09: 180 mg via INTRAVENOUS

## 2023-01-09 MED ORDER — DEXAMETHASONE SODIUM PHOSPHATE 10 MG/ML IJ SOLN
INTRAMUSCULAR | Status: DC | PRN
  Administered 2023-01-09: 8 mg via INTRAVENOUS

## 2023-01-09 MED ORDER — SODIUM CHLORIDE 0.9 % IR SOLN
Status: DC | PRN
  Administered 2023-01-09: 9000 mL

## 2023-01-09 MED ORDER — CEFAZOLIN SODIUM-DEXTROSE 2-4 GM/100ML-% IV SOLN
2.0000 g | INTRAVENOUS | Status: AC
  Administered 2023-01-09: 2 g via INTRAVENOUS
  Filled 2023-01-09: qty 100

## 2023-01-09 MED ORDER — ROCURONIUM BROMIDE 10 MG/ML (PF) SYRINGE
PREFILLED_SYRINGE | INTRAVENOUS | Status: AC
  Filled 2023-01-09: qty 10

## 2023-01-09 MED ORDER — OXYCODONE HCL 5 MG PO TABS
ORAL_TABLET | ORAL | Status: AC
  Filled 2023-01-09: qty 1

## 2023-01-09 MED ORDER — OXYCODONE HCL 5 MG/5ML PO SOLN: 5.0000 mg | Freq: Once | ORAL | Status: AC | PRN

## 2023-01-09 MED ORDER — INSULIN ASPART 100 UNIT/ML IJ SOLN: 0.0000 [IU] | INTRAMUSCULAR | Status: DC | PRN

## 2023-01-09 MED ORDER — PHENAZOPYRIDINE HCL 200 MG PO TABS: 200.0000 mg | ORAL_TABLET | Freq: Three times a day (TID) | ORAL | 0 refills | Status: DC | PRN

## 2023-01-09 MED ORDER — CELECOXIB 200 MG PO CAPS
200.0000 mg | ORAL_CAPSULE | Freq: Once | ORAL | Status: AC
  Administered 2023-01-09: 200 mg via ORAL
  Filled 2023-01-09: qty 1

## 2023-01-09 MED ORDER — ONDANSETRON HCL 4 MG/2ML IJ SOLN
INTRAMUSCULAR | Status: AC
  Filled 2023-01-09: qty 2

## 2023-01-09 MED ORDER — FENTANYL CITRATE (PF) 100 MCG/2ML IJ SOLN
INTRAMUSCULAR | Status: DC | PRN
  Administered 2023-01-09 (×2): 25 ug via INTRAVENOUS

## 2023-01-09 MED ORDER — ONDANSETRON HCL 4 MG/2ML IJ SOLN
INTRAMUSCULAR | Status: DC | PRN
  Administered 2023-01-09: 4 mg via INTRAVENOUS

## 2023-01-09 MED ORDER — LIDOCAINE 2% (20 MG/ML) 5 ML SYRINGE
INTRAMUSCULAR | Status: DC | PRN
  Administered 2023-01-09: 100 mg via INTRAVENOUS

## 2023-01-09 MED ORDER — ROCURONIUM BROMIDE 10 MG/ML (PF) SYRINGE
PREFILLED_SYRINGE | INTRAVENOUS | Status: DC | PRN
  Administered 2023-01-09: 50 mg via INTRAVENOUS

## 2023-01-09 MED ORDER — FENTANYL CITRATE (PF) 100 MCG/2ML IJ SOLN
INTRAMUSCULAR | Status: AC
  Filled 2023-01-09: qty 2

## 2023-01-09 MED ORDER — OXYBUTYNIN CHLORIDE 5 MG PO TABS: 5.0000 mg | ORAL_TABLET | Freq: Three times a day (TID) | ORAL | 1 refills | Status: DC | PRN

## 2023-01-09 MED ORDER — OXYCODONE HCL 5 MG PO TABS
5.0000 mg | ORAL_TABLET | Freq: Once | ORAL | Status: AC | PRN
  Administered 2023-01-09: 5 mg via ORAL

## 2023-01-09 MED ORDER — CHLORHEXIDINE GLUCONATE 0.12 % MT SOLN
15.0000 mL | Freq: Once | OROMUCOSAL | Status: AC
  Administered 2023-01-09: 15 mL via OROMUCOSAL

## 2023-01-09 MED ORDER — CEPHALEXIN 500 MG PO CAPS: 500.0000 mg | ORAL_CAPSULE | Freq: Two times a day (BID) | ORAL | 0 refills | Status: AC

## 2023-01-09 MED ORDER — ONDANSETRON HCL 4 MG/2ML IJ SOLN: 4.0000 mg | Freq: Once | INTRAMUSCULAR | Status: DC | PRN

## 2023-01-09 MED ORDER — FENTANYL CITRATE PF 50 MCG/ML IJ SOSY
PREFILLED_SYRINGE | INTRAMUSCULAR | Status: AC
  Filled 2023-01-09: qty 3

## 2023-01-09 MED ORDER — ACETAMINOPHEN 325 MG PO TABS: 325.0000 mg | ORAL_TABLET | ORAL | Status: DC | PRN

## 2023-01-09 MED ORDER — ACETAMINOPHEN 500 MG PO TABS
1000.0000 mg | ORAL_TABLET | Freq: Once | ORAL | Status: AC
  Administered 2023-01-09: 1000 mg via ORAL
  Filled 2023-01-09: qty 2

## 2023-01-09 MED ORDER — PROPOFOL 10 MG/ML IV BOLUS
INTRAVENOUS | Status: DC | PRN
  Administered 2023-01-09: 150 mg via INTRAVENOUS

## 2023-01-09 MED ORDER — LIDOCAINE HCL (PF) 2 % IJ SOLN
INTRAMUSCULAR | Status: AC
  Filled 2023-01-09: qty 5

## 2023-01-09 MED ORDER — GEMCITABINE CHEMO FOR BLADDER INSTILLATION 2000 MG: 2000.0000 mg | Freq: Once | INTRAVENOUS | Status: DC

## 2023-01-09 MED ORDER — ORAL CARE MOUTH RINSE: 15.0000 mL | Freq: Once | OROMUCOSAL | Status: AC

## 2023-01-09 MED ORDER — MEPERIDINE HCL 50 MG/ML IJ SOLN: 6.2500 mg | INTRAMUSCULAR | Status: DC | PRN

## 2023-01-09 MED ORDER — ACETAMINOPHEN 160 MG/5ML PO SOLN: 325.0000 mg | ORAL | Status: DC | PRN

## 2023-01-09 MED ORDER — PROPOFOL 10 MG/ML IV BOLUS
INTRAVENOUS | Status: AC
  Filled 2023-01-09: qty 20

## 2023-01-09 SURGICAL SUPPLY — 12 items
BAG URO CATCHER STRL LF (MISCELLANEOUS) ×1 IMPLANT
CATH URETL OPEN 5X70 (CATHETERS) ×1 IMPLANT
CLOTH BEACON ORANGE TIMEOUT ST (SAFETY) ×1 IMPLANT
GLOVE SURG LX STRL 7.5 STRW (GLOVE) ×1 IMPLANT
GOWN STRL REUS W/ TWL XL LVL3 (GOWN DISPOSABLE) ×1 IMPLANT
GOWN STRL REUS W/TWL XL LVL3 (GOWN DISPOSABLE) ×1
GUIDEWIRE ZIPWRE .038 STRAIGHT (WIRE) ×1 IMPLANT
KIT TURNOVER KIT A (KITS) IMPLANT
LOOP CUT BIPOLAR 24F LRG (ELECTROSURGICAL) IMPLANT
MANIFOLD NEPTUNE II (INSTRUMENTS) ×1 IMPLANT
PACK CYSTO (CUSTOM PROCEDURE TRAY) ×1 IMPLANT
TUBING CONNECTING 10 (TUBING) ×1 IMPLANT

## 2023-01-09 NOTE — Op Note (Signed)
Operative Note  Preoperative diagnosis:  1.  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  Papillary lesion, versus mucosal edema involving the anterior bladder and prostate base measuring approximately 3 cm 2.  Phimosis  Procedure(s): 1.  Cystoscopy with TURBT (medium)  Surgeon: Rhoderick Moody, MD  Assistants:  None  Anesthesia:  General  Complications:  None  EBL: 50 mL  Specimens: 1.  Anterior bladder/prostate base papillary lesion versus mucosal edema  Drains/Catheters: 1.  None  Intraoperative findings:   Phimosis Bladder/prostate lesion as described above  Indication:  Bryan Stafford is a 77 y.o. male with a history of recurrent episodes of gross hematuria.  He underwent an office cystoscopy recently and was found to have what appeared to be a papillary tumor involving the anterior bladder wall/prostate base.  He has been consented for the above procedures, voices understanding and wishes to proceed.  Description of procedure:  After informed consent was obtained, the patient was brought to the operating room and general endotracheal anesthesia was administered. The patient was then placed in the dorsolithotomy position and prepped and draped in the usual sterile fashion. A timeout was performed. A 23 French rigid cystoscope was then inserted into the urethral meatus and advanced into the bladder under direct vision. A complete bladder survey revealed a papillary lesion vs mucosal edema involving the anterior bladder wall/prostate base.  Due to the atypical nature of this lesion, I proceeded with transurethral resection.  A 26 French resectoscope with a bipolar loop working element was inserted into the bladder and the anterior bladder wall/prostate base lesion was completely resected.  All tissue specimens were then evacuated from the lumen of the bladder through the sheath of the resectoscope.  The area of resection was  extensively fulgurated until hemostasis was achieved.  Due to the minimally invasive nature of the resection, I decided not to leave a Foley catheter.  Also, due to the atypical nature of the tissue, I decided against intravesical gemcitabine.  The patient tolerated the procedure well and was transferred to the postanesthesia unit in stable condition.  Plan: Discharge home

## 2023-01-09 NOTE — Anesthesia Postprocedure Evaluation (Signed)
Anesthesia Post Note  Patient: Royalty Reinhardt  Procedure(s) Performed: TRANSURETHRAL RESECTION OF BLADDER TUMOR (TURBT) CYSTOSCOPY     Patient location during evaluation: PACU Anesthesia Type: General Level of consciousness: awake and alert Pain management: pain level controlled Vital Signs Assessment: post-procedure vital signs reviewed and stable Respiratory status: spontaneous breathing, nonlabored ventilation, respiratory function stable and patient connected to nasal cannula oxygen Cardiovascular status: blood pressure returned to baseline and stable Postop Assessment: no apparent nausea or vomiting Anesthetic complications: no   No notable events documented.  Last Vitals:  Vitals:   01/09/23 1248 01/09/23 1309  BP: 135/89 (!) 149/84  Pulse: 66 71  Resp: 19 18  Temp:  37 C  SpO2: 98% 94%    Last Pain:  Vitals:   01/09/23 1309  TempSrc: Oral  PainSc: 0-No pain                 Josimar Corning

## 2023-01-09 NOTE — Anesthesia Preprocedure Evaluation (Addendum)
Anesthesia Evaluation  Patient identified by MRN, date of birth, ID band Patient awake    Reviewed: Allergy & Precautions, NPO status , Patient's Chart, lab work & pertinent test results  Airway Mallampati: II  TM Distance: >3 FB Neck ROM: Full    Dental no notable dental hx. (+) Teeth Intact, Dental Advisory Given, Caps   Pulmonary sleep apnea    Pulmonary exam normal breath sounds clear to auscultation       Cardiovascular hypertension, Pt. on medications Normal cardiovascular exam Rhythm:Regular Rate:Normal     Neuro/Psych negative neurological ROS  negative psych ROS   GI/Hepatic Neg liver ROS,GERD  ,,  Endo/Other  diabetes, Type 2    Renal/GU negative Renal ROS  negative genitourinary   Musculoskeletal negative musculoskeletal ROS (+)    Abdominal   Peds negative pediatric ROS (+)  Hematology  (+) Blood dyscrasia, anemia   Anesthesia Other Findings   Reproductive/Obstetrics negative OB ROS                             Anesthesia Physical Anesthesia Plan  ASA: 3  Anesthesia Plan: General   Post-op Pain Management: Minimal or no pain anticipated, Tylenol PO (pre-op)*, Celebrex PO (pre-op)* and Ketamine IV*   Induction: Intravenous  PONV Risk Score and Plan: 1 and Treatment may vary due to age or medical condition, Ondansetron and Dexamethasone  Airway Management Planned: Simple Face Mask, Oral ETT and LMA  Additional Equipment:   Intra-op Plan:   Post-operative Plan: Extubation in OR  Informed Consent: I have reviewed the patients History and Physical, chart, labs and discussed the procedure including the risks, benefits and alternatives for the proposed anesthesia with the patient or authorized representative who has indicated his/her understanding and acceptance.     Dental advisory given  Plan Discussed with: CRNA and Anesthesiologist  Anesthesia Plan Comments:  (  )        Anesthesia Quick Evaluation

## 2023-01-09 NOTE — Anesthesia Procedure Notes (Signed)
Procedure Name: Intubation Date/Time: 01/09/2023 11:01 AM  Performed by: Elisabeth Cara, CRNAPre-anesthesia Checklist: Patient identified, Emergency Drugs available, Suction available, Patient being monitored and Timeout performed Patient Re-evaluated:Patient Re-evaluated prior to induction Oxygen Delivery Method: Circle system utilized Preoxygenation: Pre-oxygenation with 100% oxygen Induction Type: IV induction Ventilation: Mask ventilation without difficulty Laryngoscope Size: Mac and 4 Grade View: Grade I Tube type: Oral Tube size: 7.5 mm Number of attempts: 1 Airway Equipment and Method: Stylet Placement Confirmation: ETT inserted through vocal cords under direct vision, positive ETCO2 and breath sounds checked- equal and bilateral Secured at: 21 cm Tube secured with: Tape Dental Injury: Teeth and Oropharynx as per pre-operative assessment

## 2023-01-09 NOTE — H&P (Signed)
PRE-OP H&P  Office Visit Report     12/10/2022   --------------------------------------------------------------------------------   Bryan Stafford. Heagney  MRN: [id number]  DOB: [birth year], 77 year old Male  SSN:    PRIMARY CARE:  Bertram Millard. Hyacinth Meeker, MD  PRIMARY CARE FAX:  [phone]  REFERRING:  Si Raider. Liliane Shi, MD  PROVIDER:  Rhoderick Moody, M.D.  LOCATION:  [facility] [phone]     CC/HPI: CC: Hematuria   HPI: Mr. Bryan Stafford is a 78 year old male referred by Dr. Hyacinth Meeker for a hematuria evaluation.   10/18/22:  -Intermittent episodes of painless gross hematuria in July 2023 that temporarily resolved and then recurred 2 months ago. His most recent episode of hematuria was associated with urinary urgency, hesitancy and frequency q 1 hour. Treated with a course of bactrim for suspected UTI by his PCP. No urine culture or UA available.  -Unable to leave UA today  -Smoking history: Non-smoker  -History of nephrolithiasis: Remote history of passing one stone in his 32s  -History of CKD: denies  -Anti-coagulant/anti-platelet use: denies  -Hematologic disorders: denies  -History of GU surgery/trauma: denies  -Personal/family history of GU malignancies: denies  -Previously lived in [place], [place]   12/10/22: The patient is here today for a cystoscopy to complete his hematuria evaluation. 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     ALLERGIES: No Allergies    MEDICATIONS: Metformin Hcl 1,000 mg tablet 1 tablet PO BID  Tamsulosin Hcl 0.4 mg capsule 1 capsule PO Q 12 H  Tamsulosin Hcl 0.4 mg capsule  Ferrous Sulfate 325 mg (65 mg iron) tablet  Pantoprazole Sodium  Ramipril 5 mg capsule  Rosuvastatin Calcium  Sertraline Hcl 50 mg tablet  Vitamin B12     GU PSH: Locm 300-399Mg /Ml Iodine,1Ml - 11/19/2022     NON-GU PSH: Appendectomy     GU PMH: Gross hematuria - 11/19/2022, - 10/18/2022       PMH Notes: bleeding disorder   NON-GU PMH: GERD Hypercholesterolemia Hypertension Sleep Apnea    FAMILY HISTORY: 1 Daughter - Daughter 1 son - Son   SOCIAL HISTORY: Marital Status: Married Preferred Language: English; Ethnicity: Not Hispanic Or Latino; Race: White Current Smoking Status: Patient has never smoked.   Tobacco Use Assessment Completed: Used Tobacco in last 30 days? Does drink.  Drinks 1 caffeinated drink per day.    REVIEW OF SYSTEMS:    GU Review Male:   Patient denies frequent urination, hard to postpone urination, burning/ pain with urination, get up at night to urinate, leakage of urine, stream starts and stops, trouble starting your stream, have to strain to urinate , erection problems, and penile pain.  Gastrointestinal (Upper):   Patient denies nausea, vomiting, and indigestion/ heartburn.  Gastrointestinal (Lower):   Patient denies diarrhea and constipation.  Constitutional:   Patient denies fever, night sweats, weight loss, and fatigue.  Skin:   Patient denies skin rash/ lesion and itching.  Eyes:   Patient denies blurred vision and double vision.  Ears/ Nose/ Throat:   Patient denies sore throat and sinus problems.  Hematologic/Lymphatic:   Patient denies swollen glands and easy bruising.  Cardiovascular:   Patient denies chest pains and leg swelling.  Respiratory:   Patient denies cough and shortness of breath.  Endocrine:   Patient denies excessive thirst.  Musculoskeletal:   Patient denies back pain and joint pain.  Neurological:   Patient denies headaches and dizziness.  Psychologic:   Patient denies depression and anxiety.   VITAL SIGNS: None   Complexity of Data:  Records Review:   Previous Patient Records  X-Ray Review: C.T. Hematuria: Reviewed Films. Reviewed Report. Discussed With Patient.     10/18/22  PSA  Total PSA 3.52 ng/mL   Notes:                     CLINICAL DATA: Gross hematuria. Nephrolithiasis.   EXAM:  CT ABDOMEN AND  PELVIS WITHOUT AND WITH CONTRAST   TECHNIQUE:  Multidetector CT imaging of the abdomen and pelvis was performed  following the standard protocol before and following the bolus  administration of intravenous contrast.   RADIATION DOSE REDUCTION: This exam was performed according to the  departmental dose-optimization program which includes automated  exposure control, adjustment of the mA and/or kV according to  patient size and/or use of iterative reconstruction technique.   CONTRAST: 125 mL Omnipaque 300   COMPARISON: 06/21/2021   FINDINGS:  Lower Chest: No acute findings.   Hepatobiliary: No hepatic masses identified. Gallbladder is  unremarkable. No evidence of biliary ductal dilatation.   Pancreas: No mass or inflammatory changes.   Spleen: Multiple small low-attenuation lesions are again seen  throughout the spleen, without change compared to previous study.  These are consistent with benign etiology seen with sarcoidosis or  other granulomatous disease.   Adrenals/Urinary Tract: No adrenal masses identified. No evidence of  urolithiasis. No suspicious renal masses identified. No masses seen  involving the collecting systems, ureters, or bladder. 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 No obstructing calculi or mass identified. The lower  pole collecting system and left ureter are nondilated.   Stomach/Bowel: No evidence of obstruction, inflammatory process or  abnormal fluid collections.   Vascular/Lymphatic: No pathologically enlarged lymph nodes. No acute  vascular findings. Aortic atherosclerotic calcification incidentally  noted.   Reproductive: Mildly enlarged prostate gland again seen.   Other: None.   Musculoskeletal: No suspicious bone lesions identified.   IMPRESSION:  No radiographic evidence  of urinary tract neoplasm, urolithiasis, or  other acute findings.   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 Consider  nuclear medicine renal scan or retrograde ureteroscopy for further  evaluation.   Stable mildly enlarged prostate.   Stable small low-attenuation splenic lesions, consistent with benign  etiology and possibly due to sarcoidosis or other granulomatous  disease.   Aortic Atherosclerosis (ICD10-I70.0).    Electronically Signed  By: Danae Orleans M.D.  On: 11/23/2022 19:32      PROCEDURES:         Flexible Cystoscopy - 52000  Risks, benefits, and some of the potential complications of the procedure were discussed at length with the patient including infection, bleeding, voiding discomfort, urinary retention, fever, chills, sepsis, and others. All questions were answered. Informed consent was obtained. Antibiotic prophylaxis was given. Sterile technique and intraurethral analgesia were used.  Meatus:  Normal size. Normal location. Normal condition.  Urethra:  No strictures.  External Sphincter:  Normal.  Verumontanum:  Normal.  Prostate:  Non-obstructing. No hyperplasia.  Bladder Neck:  Non-obstructing.  Ureteral Orifices:  Normal location. Normal size. Normal shape. Effluxed clear urine.  Bladder:  3+ cm tumor. No trabeculation. Normal mucosa. No stones.  The lower urinary tract was carefully examined. The procedure was well-tolerated and without complications. Antibiotic instructions were given. Instructions were given to call the office immediately for bloody urine, difficulty urinating, urinary retention, painful or frequent urination, fever, chills, nausea, vomiting or other illness. The patient stated that he understood these instructions and would comply with them.         Urinalysis w/Scope Dipstick Dipstick Cont'd Micro  Color: Yellow Bilirubin: Neg mg/dL WBC/hpf: 0  - 5/hpf  Appearance: Clear Ketones: Neg mg/dL RBC/hpf: 10 - 46/NGE  Specific Gravity: 1.025 Blood: 1+ ery/uL Bacteria: Rare (0-9/hpf)  pH: <=5.0 Protein: Neg mg/dL Cystals: NS (Not Seen)  Glucose: Neg mg/dL Urobilinogen: 0.2 mg/dL Casts: NS (Not Seen)    Nitrites: Neg Trichomonas: Not Present    Leukocyte Esterase: Neg leu/uL Mucous: Not Present      Epithelial Cells: 0 - 5/hpf      Yeast: NS (Not Seen)      Sperm: Not Present    ASSESSMENT:      ICD-10 Details  1 GU:   Bladder tumor/neoplasm - D41.4 Undiagnosed New Problem  2   Gross hematuria - R31.0 Self-Limited  3   Hydronephrosis - N13.0 Left, Undiagnosed New Problem - 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   The risks, benefits and alternatives of cystoscopy with TURBT and gemcitabine instillation was discussed with the patient. The risks include, but are not limited to, bleeding, urinary tract infection, bladder perforation requiring prolonged catheterization and/or open bladder repair, ureteral obstruction, voiding dysfunction and the inherent risks of general anesthesia. The patient voices understanding and wishes to proceed.   He will eventually need a Lasix renogram to assess for any outflow obstruction involving his duplicated left kidney

## 2023-01-09 NOTE — Transfer of Care (Signed)
Immediate Anesthesia Transfer of Care Note  Patient: Bryan Stafford  Procedure(s) Performed: TRANSURETHRAL RESECTION OF BLADDER TUMOR (TURBT) CYSTOSCOPY  Patient Location: PACU  Anesthesia Type:General  Level of Consciousness: drowsy and patient cooperative  Airway & Oxygen Therapy: Patient Spontanous Breathing and Patient connected to face mask oxygen  Post-op Assessment: Report given to RN, Post -op Vital signs reviewed and stable, and Patient moving all extremities  Post vital signs: Reviewed and stable  Last Vitals:  Vitals Value Taken Time  BP 145/98 01/09/23 1155  Temp    Pulse 78 01/09/23 1157  Resp 17 01/09/23 1157  SpO2 100 % 01/09/23 1157  Vitals shown include unfiled device data.  Last Pain:  Vitals:   01/09/23 0909  TempSrc:   PainSc: 0-No pain         Complications: No notable events documented.

## 2023-01-10 ENCOUNTER — Encounter (HOSPITAL_COMMUNITY): Payer: Self-pay | Admitting: Urology

## 2023-01-16 ENCOUNTER — Ambulatory Visit: Payer: Medicare Other | Admitting: Family Medicine

## 2023-01-24 ENCOUNTER — Other Ambulatory Visit: Payer: Self-pay

## 2023-01-24 DIAGNOSIS — E785 Hyperlipidemia, unspecified: Secondary | ICD-10-CM

## 2023-01-24 MED ORDER — ROSUVASTATIN CALCIUM 10 MG PO TABS: 10.0000 mg | ORAL_TABLET | Freq: Every day | ORAL | 1 refills | Status: DC

## 2023-01-24 NOTE — Telephone Encounter (Signed)
Refill requst received from pharmacy

## 2023-03-06 ENCOUNTER — Other Ambulatory Visit: Payer: Self-pay

## 2023-03-06 MED ORDER — METFORMIN HCL 1000 MG PO TABS: 1000.0000 mg | ORAL_TABLET | Freq: Two times a day (BID) | ORAL | 1 refills | Status: DC

## 2023-04-07 IMAGING — CT CT ABD-PELV W/ CM
1 of 3 series · 13 of 32 positions shown, 18 images · IV contrast (APPLIED)
Comparison: None.

CLINICAL DATA: Generalized abdominal discomfort.  Anemia.

EXAM:
CT ABDOMEN AND PELVIS WITH CONTRAST
TECHNIQUE: Multidetector CT imaging of the abdomen and pelvis was performed
using the standard protocol following bolus administration of
intravenous contrast.
CONTRAST:  100mL GAVR7G-BWW IOPAMIDOL (GAVR7G-BWW) INJECTION 61%

[Series 2: abd/pelvis w/cm · axial · 0.88mm/px · z∈[-552,-62]mm · 13 of 114 slices shown, 18 images]
[im 8/114  soft-tissue]
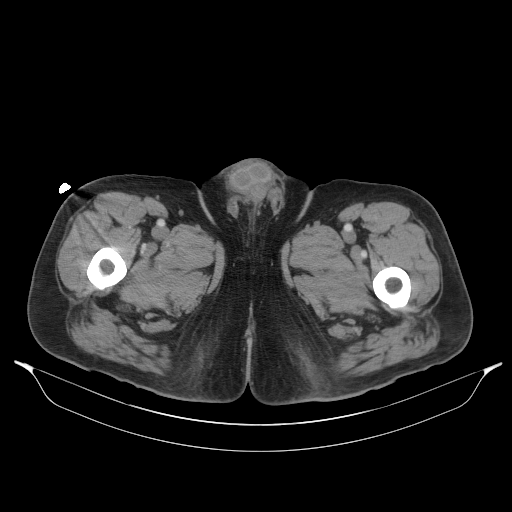
[im 8/114  bone]
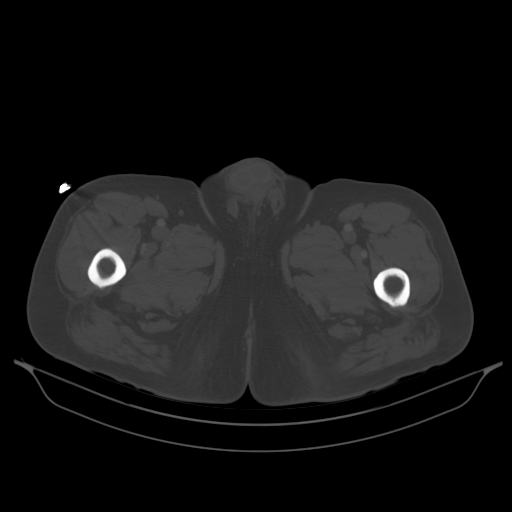
[im 16/114  soft-tissue]
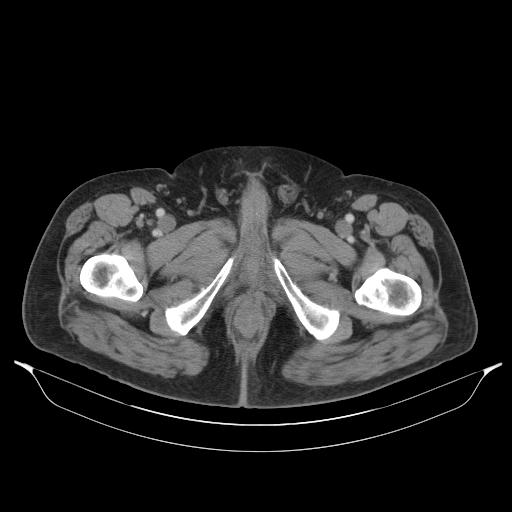
[im 23/114  soft-tissue]
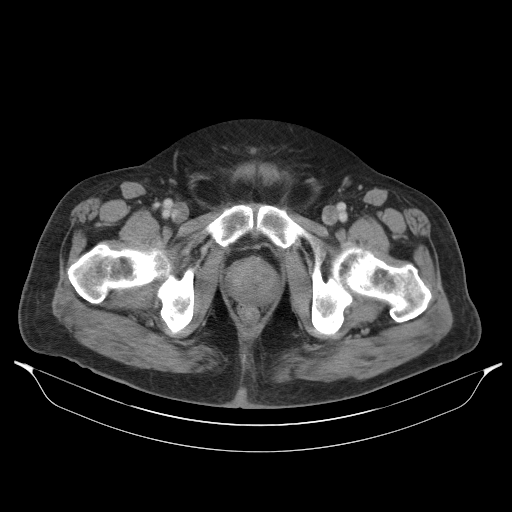
[im 38/114  soft-tissue]
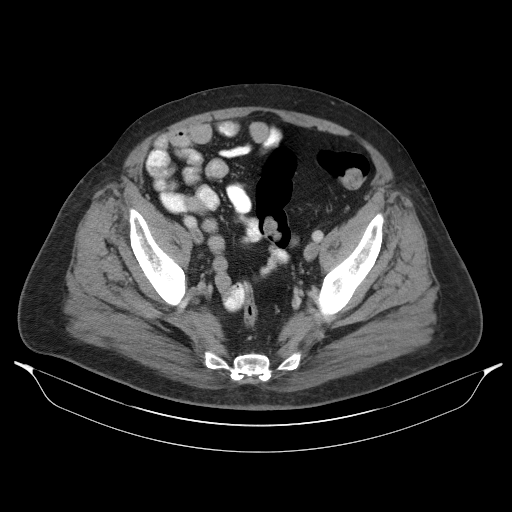
[im 46/114  soft-tissue]
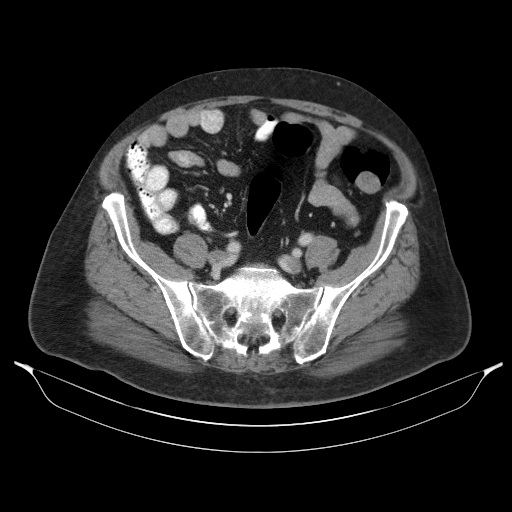
[im 53/114  soft-tissue]
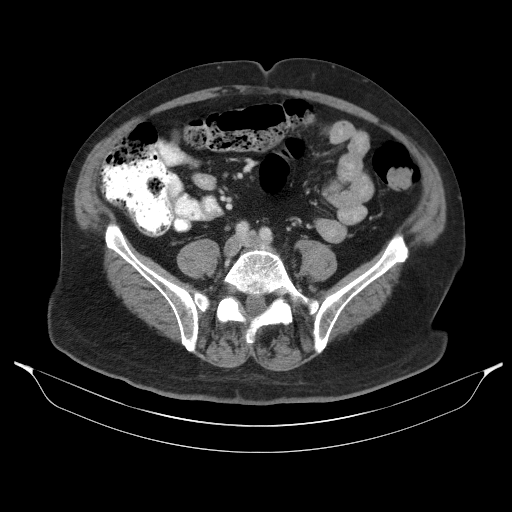
[im 61/114  soft-tissue]
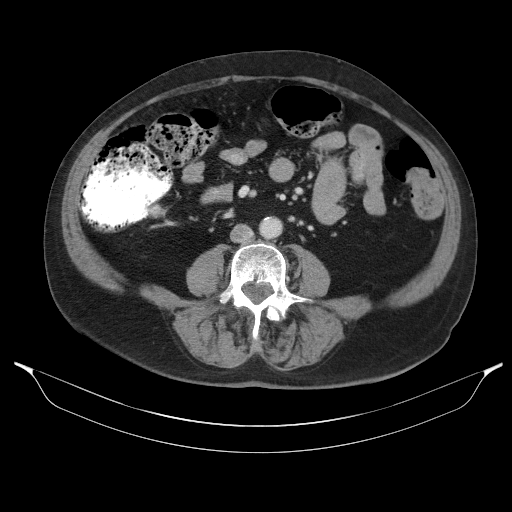
[im 68/114  soft-tissue]
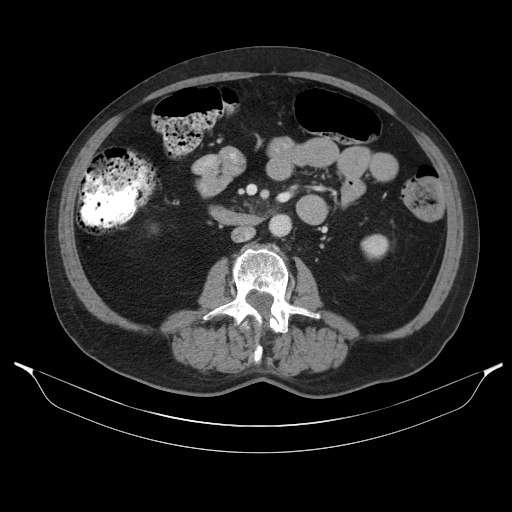
[im 76/114  soft-tissue]
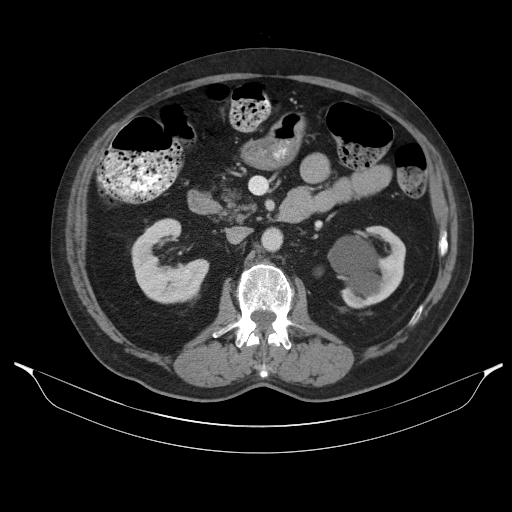
[im 76/114  bone]
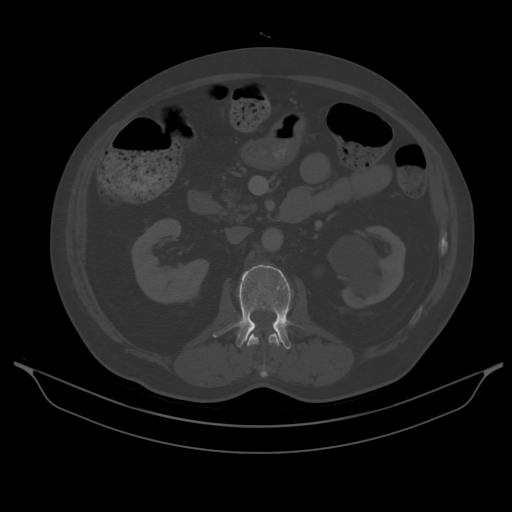
[im 83/114  lung]
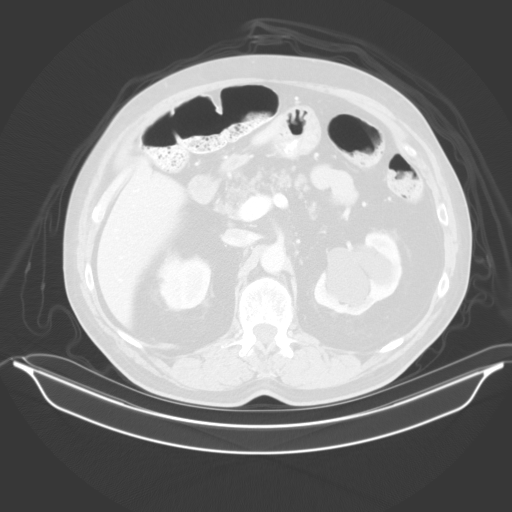
[im 91/114  soft-tissue]
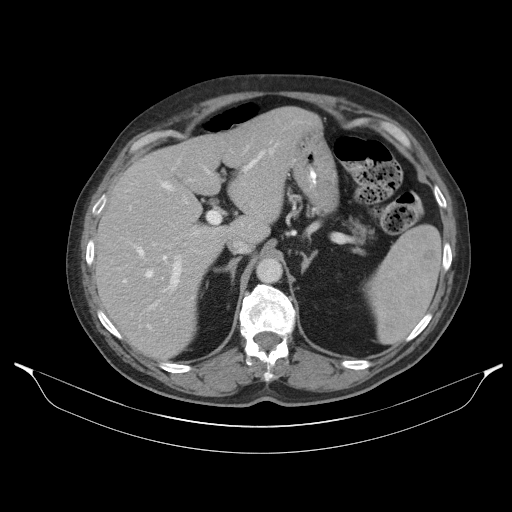
[im 91/114  lung]
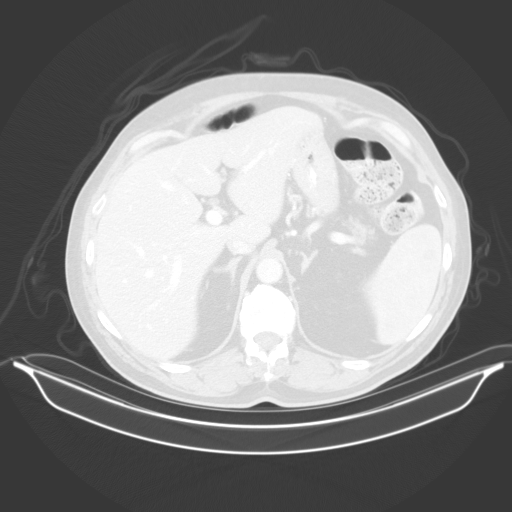
[im 98/114  soft-tissue]
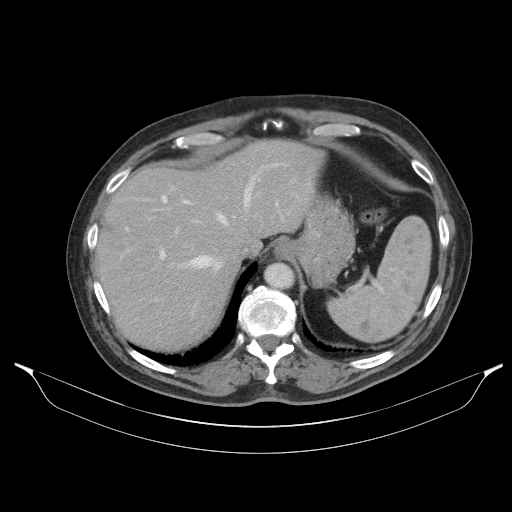
[im 98/114  lung]
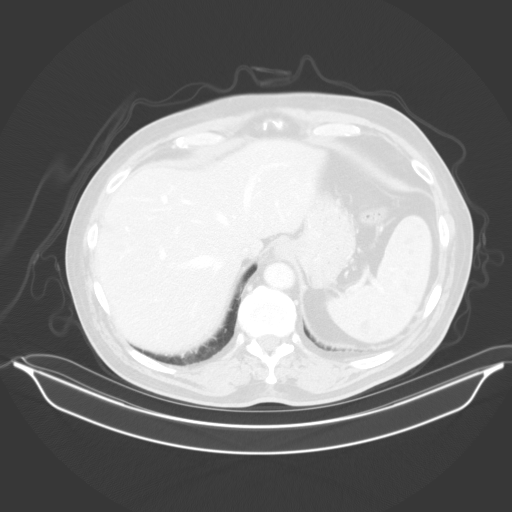
[im 106/114  soft-tissue]
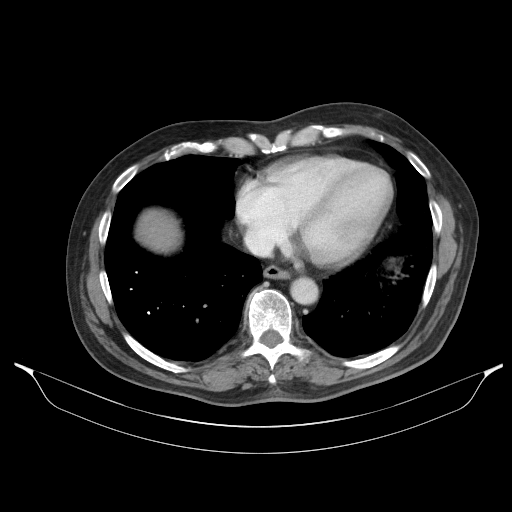
[im 106/114  lung]
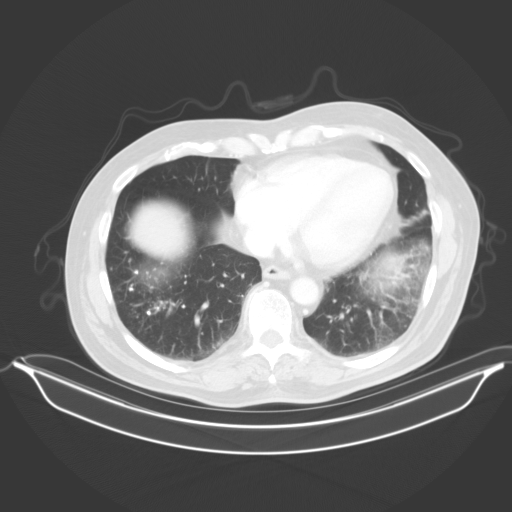

[13 of 32 positions shown; findings below may reference images not displayed]

FINDINGS: Lower chest: Innumerable tiny calcifications at the lung bases.
Findings are suggestive for post infectious or inflammatory changes.
No pleural effusions. Patchy densities at the left lung base could
represent atelectasis or scarring.

Hepatobiliary: Normal appearance of the liver, gallbladder and
portal venous system.

Pancreas: Unremarkable. No pancreatic ductal dilatation or
surrounding inflammatory changes.

Spleen: Innumerable low-density structures throughout the spleen.
Index lesion measures 1.6 cm on sequence 2 image 21. Spleen is
within normal limits for size.

Adrenals/Urinary Tract: Normal appearance of the adrenal glands.
Normal appearance of the urinary bladder. Large left renal sinus
cysts. Small right renal cysts. Negative for hydronephrosis. No
suspicious renal lesions.

Stomach/Bowel: Large amount of stool in the colon. No evidence for
small bowel dilatation. Normal appearance of the stomach.

Vascular/Lymphatic: Aortic atherosclerosis. No enlarged abdominal or
pelvic lymph nodes.

Reproductive: Prostate is prominent for size measuring 4.9 cm in the
transverse dimension.

Other: Negative for free fluid. Negative for free air. Tiny
umbilical hernia containing fat.

Musculoskeletal: No acute bone abnormality.
IMPRESSION: 1. Multiple small hypodensities throughout the spleen without
splenic enlargement. These findings are nonspecific but could
represent multiple hemangiomas or hamartomas. Infectious etiology
such as fungal infection could have a similar appearance and
recommend correlation with history and immune status. Lymphoma or
neoplastic process cannot be completely excluded but thought to be
less likely. Recommend further characterization with MRI of the
abdomen, without with and without contrast.
2. Large amount of stool in the colon which may be contributing to
patient's abdominal discomfort. No evidence for bowel obstruction.
3. Multiple tiny calcifications at the lung bases. Suspect this
represents post infectious or inflammatory changes.
4. Bilateral renal cysts.  No hydronephrosis.

## 2023-04-11 ENCOUNTER — Other Ambulatory Visit (HOSPITAL_COMMUNITY): Payer: Self-pay | Admitting: Urology

## 2023-04-11 DIAGNOSIS — N1330 Unspecified hydronephrosis: Secondary | ICD-10-CM

## 2023-04-26 ENCOUNTER — Ambulatory Visit (HOSPITAL_COMMUNITY): Payer: Medicare Other

## 2023-05-02 ENCOUNTER — Other Ambulatory Visit: Payer: Self-pay

## 2023-05-02 DIAGNOSIS — I10 Essential (primary) hypertension: Secondary | ICD-10-CM

## 2023-05-03 ENCOUNTER — Other Ambulatory Visit: Payer: Self-pay

## 2023-05-03 DIAGNOSIS — I10 Essential (primary) hypertension: Secondary | ICD-10-CM

## 2023-05-03 MED ORDER — RAMIPRIL 5 MG PO CAPS
ORAL_CAPSULE | ORAL | 1 refills | Status: DC
Start: 2023-05-03 — End: 2023-10-29

## 2023-05-20 ENCOUNTER — Other Ambulatory Visit: Payer: Self-pay | Admitting: *Deleted

## 2023-05-20 DIAGNOSIS — F32A Depression, unspecified: Secondary | ICD-10-CM

## 2023-05-20 MED ORDER — SERTRALINE HCL 50 MG PO TABS: 75.0000 mg | ORAL_TABLET | Freq: Every day | ORAL | 1 refills | Status: DC

## 2023-06-28 ENCOUNTER — Encounter: Payer: Self-pay | Admitting: Adult Health

## 2023-06-28 ENCOUNTER — Ambulatory Visit (INDEPENDENT_AMBULATORY_CARE_PROVIDER_SITE_OTHER): Payer: Medicare Other | Admitting: Adult Health

## 2023-06-28 VITALS — BP 118/70 | HR 76 | Temp 97.1°F | Ht 71.0 in | Wt 190.0 lb

## 2023-06-28 DIAGNOSIS — I10 Essential (primary) hypertension: Secondary | ICD-10-CM

## 2023-06-28 DIAGNOSIS — G8929 Other chronic pain: Secondary | ICD-10-CM

## 2023-06-28 DIAGNOSIS — Z113 Encounter for screening for infections with a predominantly sexual mode of transmission: Secondary | ICD-10-CM

## 2023-06-28 DIAGNOSIS — E1169 Type 2 diabetes mellitus with other specified complication: Secondary | ICD-10-CM

## 2023-06-28 DIAGNOSIS — D509 Iron deficiency anemia, unspecified: Secondary | ICD-10-CM | POA: Diagnosis not present

## 2023-06-28 DIAGNOSIS — N400 Enlarged prostate without lower urinary tract symptoms: Secondary | ICD-10-CM

## 2023-06-28 DIAGNOSIS — F339 Major depressive disorder, recurrent, unspecified: Secondary | ICD-10-CM

## 2023-06-28 DIAGNOSIS — E785 Hyperlipidemia, unspecified: Secondary | ICD-10-CM

## 2023-06-28 DIAGNOSIS — M25512 Pain in left shoulder: Secondary | ICD-10-CM

## 2023-06-28 DIAGNOSIS — K219 Gastro-esophageal reflux disease without esophagitis: Secondary | ICD-10-CM

## 2023-06-28 MED ORDER — SERTRALINE HCL 50 MG PO TABS
50.0000 mg | ORAL_TABLET | Freq: Every day | ORAL | Status: DC
Start: 2023-06-28 — End: 2023-08-20

## 2023-06-28 MED ORDER — PANTOPRAZOLE SODIUM 40 MG PO TBEC
40.0000 mg | DELAYED_RELEASE_TABLET | Freq: Every day | ORAL | Status: DC
Start: 2023-06-28 — End: 2023-08-27

## 2023-06-28 NOTE — Progress Notes (Signed)
[facility] clinic  Provider:  Kenard Gower DNP  Code Status:  DNR  Goals of Care:     06/28/2023    9:33 AM  Advanced Directives  Does Patient Have a Medical Advance Directive? Yes  Type of Advance Directive Living will;Out of facility DNR (pink MOST or yellow form)  Does patient want to make changes to medical advance directive? No - Patient declined     Chief Complaint  Patient presents with   Medical Management of Chronic Issues    Routine visit. Discuss  flu (refused), covid, pneumonia, eye exam (not sure of exact name of eye dr.), hep c screening, td/tdap, shingrix, and diabetic kidney evaluation and foot exam. Fill out DNR. Discuss medications, dosage and instructions. Patient only taking protonix once daily versus twice. Patient only taking zoloft 1 pill daily verse how it's listed as 1.5 pills daily. Taking flomax twice daily versus once daily.     HPI: Patient is a 78 y.o. male seen today for follow up of chronic medical issues.  Does not take Ditropan and Pyridium. Eats for 2 hours/day, sleeps 12 hours/day. Stated that he does not expect to live in 4 years. He denies plans of hurting self. Signed DNR. Has a daughter who is a Proofreader.   Left shoulder pain X 6-8 months ago, which sometimes travels to the elbow, Takes Advil PRN, does not want x-ray  Iron deficiency anemia, unspecified iron deficiency anemia type -  takes Iron once a day, hgb 14.1 (12/19/22)  Type 2 diabetes mellitus with other specified complication, without long-term current use of insulin (HCC)-  does not check blood sugar, takes metformin  Primary hypertension -  BP 118/70, takes Ramipril  Major depression, recurrent, chronic (HCC) -  feels better, takes Sertraline 50 mg daily  Hyperlipidemia, unspecified hyperlipidemia type -  takes Rosuvastatin, eats very few vegetable, eats crackers with peanut butter and 2 oranges  Benign prostatic hyperplasia, unspecified whether lower urinary tract  symptoms present -  takes Tamsulosin, follows up with Dr. Sande Brothers, urologist, gets up 3X/night  Past Medical History:  Diagnosis Date   Allergy    Anemia    Anxiety    Depression    Diabetes mellitus without complication (HCC)    GERD (gastroesophageal reflux disease)    History of kidney stones 1971   Hyperlipidemia    Hypertension    Sleep apnea     Past Surgical History:  Procedure Laterality Date   APPENDECTOMY  1952   CYSTOSCOPY N/A 01/09/2023   Procedure: CYSTOSCOPY;  Surgeon: Rene Paci, MD;  Location: [facility];  Service: Urology;  Laterality: N/A;   ENTEROSCOPY N/A 11/16/2021   Procedure: ENTEROSCOPY;  Surgeon: Jenel Lucks, MD;  Location: [facility];  Service: Gastroenterology;  Laterality: N/A;   HEMOSTASIS CLIP PLACEMENT  11/16/2021   Procedure: HEMOSTASIS CLIP PLACEMENT;  Surgeon: Jenel Lucks, MD;  Location: [facility];  Service: Gastroenterology;;   HOT HEMOSTASIS N/A 11/16/2021   Procedure: HOT HEMOSTASIS (ARGON PLASMA COAGULATION/BICAP);  Surgeon: Jenel Lucks, MD;  Location: [facility];  Service: Gastroenterology;  Laterality: N/A;   LUMBAR DISC SURGERY     POLYPECTOMY  11/16/2021   Procedure: POLYPECTOMY;  Surgeon: Jenel Lucks, MD;  Location: [facility];  Service: Gastroenterology;;   Sunnie Nielsen LIFTING INJECTION  11/16/2021   Procedure: SUBMUCOSAL LIFTING INJECTION;  Surgeon: Jenel Lucks, MD;  Location: [facility];  Service: Gastroenterology;;    No Known Allergies  Outpatient Encounter Medications as of  06/28/2023  Medication Sig   ferrous sulfate 325 (65 FE) MG EC tablet Take 325 mg by mouth daily.   ibuprofen (ADVIL) 200 MG tablet Take 200 mg by mouth as needed. For shoulder pain   metFORMIN (GLUCOPHAGE) 1000 MG tablet Take 1 tablet (1,000 mg total) by mouth 2 (two) times daily.   pantoprazole (PROTONIX) 40 MG tablet Take 1 tablet (40 mg total) by mouth 2 (two) times daily.   ramipril (ALTACE) 5 MG capsule  TAKE 1 CAPSULE(5 MG) BY MOUTH DAILY   rosuvastatin (CRESTOR) 10 MG tablet Take 1 tablet (10 mg total) by mouth daily.   sertraline (ZOLOFT) 50 MG tablet Take 1.5 tablets (75 mg total) by mouth daily.   tamsulosin (FLOMAX) 0.4 MG CAPS capsule Take 1 capsule (0.4 mg total) by mouth at bedtime.   oxybutynin (DITROPAN) 5 MG tablet Take 1 tablet (5 mg total) by mouth every 8 (eight) hours as needed for bladder spasms. (Patient not taking: Reported on 06/28/2023)   phenazopyridine (PYRIDIUM) 200 MG tablet Take 1 tablet (200 mg total) by mouth 3 (three) times daily as needed (for pain with urination). (Patient not taking: Reported on 06/28/2023)   No facility-administered encounter medications on file as of 06/28/2023.    Review of Systems:  Review of Systems  Constitutional:  Negative for activity change, appetite change and fever.  HENT:  Negative for sore throat.   Eyes: Negative.   Cardiovascular:  Negative for chest pain and leg swelling.  Gastrointestinal:  Negative for abdominal distention, diarrhea and vomiting.  Genitourinary:  Negative for dysuria, frequency and urgency.  Skin:  Negative for color change.  Neurological:  Negative for dizziness and headaches.  Psychiatric/Behavioral:  Negative for behavioral problems and sleep disturbance. The patient is not nervous/anxious.     Health Maintenance  Topic Date Due   Pneumonia Vaccine 15+ Years old (1 of 2 - PCV) Never done   FOOT EXAM  Never done   OPHTHALMOLOGY EXAM  Never done   Diabetic kidney evaluation - Urine ACR  Never done   Hepatitis C Screening  Never done   DTaP/Tdap/Td (1 - Tdap) Never done   Zoster Vaccines- Shingrix (1 of 2) Never done   COVID-19 Vaccine (10 - 2024-25 season) 02/10/2023   INFLUENZA VACCINE  06/11/2024 (Originally 01/10/2023)   HEMOGLOBIN A1C  07/03/2023   Diabetic kidney evaluation - eGFR measurement  12/19/2023   Medicare Annual Wellness (AWV)  01/03/2024   Colonoscopy  10/25/2024   HPV VACCINES  Aged  Out    Physical Exam: Vitals:   06/28/23 0827  BP: 118/70  Pulse: 76  Temp: (!) 97.1 F (36.2 C)  TempSrc: Temporal  SpO2: 96%  Weight: 190 lb (86.2 kg)  Height: 5\' 11"  (1.803 m)   Body mass index is 26.5 kg/m. Physical Exam Constitutional:      Appearance: Normal appearance.  HENT:     Head: Normocephalic and atraumatic.     Mouth/Throat:     Mouth: Mucous membranes are moist.  Eyes:     Conjunctiva/sclera: Conjunctivae normal.  Cardiovascular:     Rate and Rhythm: Normal rate and regular rhythm.     Pulses: Normal pulses.     Heart sounds: Normal heart sounds.  Pulmonary:     Effort: Pulmonary effort is normal.     Breath sounds: Normal breath sounds.  Abdominal:     General: Bowel sounds are normal.     Palpations: Abdomen is soft.  Musculoskeletal:  General: No swelling. Normal range of motion.     Cervical back: Normal range of motion.  Skin:    General: Skin is warm and dry.  Neurological:     General: No focal deficit present.     Mental Status: He is alert and oriented to person, place, and time.  Psychiatric:        Mood and Affect: Mood normal.        Behavior: Behavior normal.        Thought Content: Thought content normal.        Judgment: Judgment normal.     Labs reviewed: Basic Metabolic Panel: Recent Labs    12/19/22 1436  NA 139  K 5.0  CL 104  CO2 25  GLUCOSE 149*  BUN 18  CREATININE 1.47*  CALCIUM 10.9*   Liver Function Tests: No results for input(s): "AST", "ALT", "ALKPHOS", "BILITOT", "PROT", "ALBUMIN" in the last 8760 hours. No results for input(s): "LIPASE", "AMYLASE" in the last 8760 hours. No results for input(s): "AMMONIA" in the last 8760 hours. CBC: Recent Labs    07/04/22 1428 12/19/22 1436  WBC 9.4 8.5  NEUTROABS 6,298 5,372  HGB 14.1 14.1  HCT 42.5 42.9  MCV 85.3 89.9  PLT 250 233   Lipid Panel: No results for input(s): "CHOL", "HDL", "LDLCALC", "TRIG", "CHOLHDL", "LDLDIRECT" in the last 8760  hours. Lab Results  Component Value Date   HGBA1C 6.4 (H) 12/31/2022    Procedures since last visit: No results found.  Assessment/Plan  1. Iron deficiency anemia, unspecified iron deficiency anemia type (Primary) Lab Results  Component Value Date   HGB 14.1 12/19/2022    -  takes FeSO4 - CBC with Differential/Platelets  2. Type 2 diabetes mellitus with other specified complication, without long-term current use of insulin (HCC) Lab Results  Component Value Date   HGBA1C 6.4 (H) 12/31/2022   -  continue Metformin - Microalbumin/Creatinine Ratio, Urine - Complete Metabolic Panel with eGFR - Hemoglobin A1C  3. Primary hypertension -  BP stable -  continue Ramipril  4. Major depression, recurrent, chronic (HCC) -  PHQ-9 score 0, no depressed mood -   continue Sertraline 50 mg daily - sertraline (ZOLOFT) 50 MG tablet; Take 1 tablet (50 mg total) by mouth daily.  5. Hyperlipidemia, unspecified hyperlipidemia type Lab Results  Component Value Date   CHOL 138 04/26/2021   HDL 56 04/26/2021   LDLCALC 61 04/26/2021   TRIG 119 04/26/2021   CHOLHDL 2.5 04/26/2021  -  not able to re-test lipids today since he has eaten breakfast -  continue Rosuvastatin - Lipid panel; Future  6. Benign prostatic hyperplasia, unspecified whether lower urinary tract symptoms present -  continue Tamsulosin -  follows up with urology, Dr. Sande Brothers  7. Gastroesophageal reflux disease, unspecified whether esophagitis present -  denies heartburn -  continue Protonix - pantoprazole (PROTONIX) 40 MG tablet; Take 1 tablet (40 mg total) by mouth daily.  8. Screening for STD (sexually transmitted disease) - Hep C Antibody  9. Chronic left shoulder pain - ibuprofen (ADVIL) 200 MG tablet; Take 400 mg by mouth 2 (two) times daily as needed. For shoulder pain    Labs/tests ordered:  CBC, CMP. CBC, A1C  Next appt:  Visit date not found

## 2023-06-29 LAB — CBC WITH DIFFERENTIAL/PLATELET
Absolute Lymphocytes: 1383 {cells}/uL (ref 850–3900)
Absolute Monocytes: 585 {cells}/uL (ref 200–950)
Basophils Absolute: 87 {cells}/uL (ref 0–200)
Basophils Relative: 1.1 %
Eosinophils Absolute: 687 {cells}/uL — ABNORMAL HIGH (ref 15–500)
Eosinophils Relative: 8.7 %
HCT: 42.5 % (ref 38.5–50.0)
Hemoglobin: 13.6 g/dL (ref 13.2–17.1)
MCH: 29.3 pg (ref 27.0–33.0)
MCHC: 32 g/dL (ref 32.0–36.0)
MCV: 91.6 fL (ref 80.0–100.0)
MPV: 10.5 fL (ref 7.5–12.5)
Monocytes Relative: 7.4 %
Neutro Abs: 5159 {cells}/uL (ref 1500–7800)
Neutrophils Relative %: 65.3 %
Platelets: 254 10*3/uL (ref 140–400)
RBC: 4.64 10*6/uL (ref 4.20–5.80)
RDW: 12.7 % (ref 11.0–15.0)
Total Lymphocyte: 17.5 %
WBC: 7.9 10*3/uL (ref 3.8–10.8)

## 2023-06-29 LAB — MICROALBUMIN / CREATININE URINE RATIO
Creatinine, Urine: 102 mg/dL (ref 20–320)
Microalb Creat Ratio: 2 mg/g{creat} (ref <30)
Microalb, Ur: 0.2 mg/dL

## 2023-06-29 LAB — COMPLETE METABOLIC PANEL WITH GFR
AG Ratio: 1.6 (calc) (ref 1.0–2.5)
ALT: 12 U/L (ref 9–46)
AST: 14 U/L (ref 10–35)
Albumin: 4.2 g/dL (ref 3.6–5.1)
Alkaline phosphatase (APISO): 52 U/L (ref 35–144)
BUN/Creatinine Ratio: 14 (calc) (ref 6–22)
BUN: 19 mg/dL (ref 7–25)
CO2: 29 mmol/L (ref 20–32)
Calcium: 11 mg/dL — ABNORMAL HIGH (ref 8.6–10.3)
Chloride: 103 mmol/L (ref 98–110)
Creat: 1.35 mg/dL — ABNORMAL HIGH (ref 0.70–1.28)
Globulin: 2.6 g/dL (ref 1.9–3.7)
Glucose, Bld: 132 mg/dL (ref 65–139)
Potassium: 5 mmol/L (ref 3.5–5.3)
Sodium: 138 mmol/L (ref 135–146)
Total Bilirubin: 0.4 mg/dL (ref 0.2–1.2)
Total Protein: 6.8 g/dL (ref 6.1–8.1)
eGFR: 53 mL/min/{1.73_m2} — ABNORMAL LOW (ref >=60)

## 2023-06-29 LAB — HEMOGLOBIN A1C
Hgb A1c MFr Bld: 6.7 %{Hb} — ABNORMAL HIGH (ref <5.7)
Mean Plasma Glucose: 146 mg/dL
eAG (mmol/L): 8.1 mmol/L

## 2023-06-29 LAB — HEPATITIS C ANTIBODY: Hepatitis C Ab: NONREACTIVE

## 2023-07-04 ENCOUNTER — Other Ambulatory Visit: Payer: Self-pay | Admitting: Adult Health

## 2023-07-04 NOTE — Progress Notes (Signed)
-   cbc normal, no anemia -  calcium 11, elevated, was elevated 2 years ago and on 12/19/22, will need to re-check ionized calcium, PTH and Vitamin D level, I will put in order and patient needs to set up appointment for lab -  A1C 6.7, up from 6.41, continue Metformin - urine creatinine microalbumin ratio, liver enzymes normal -  hep C antibody negative

## 2023-07-15 ENCOUNTER — Encounter (HOSPITAL_COMMUNITY): Payer: Self-pay

## 2023-07-15 ENCOUNTER — Ambulatory Visit (HOSPITAL_COMMUNITY): Payer: Medicare Other

## 2023-07-16 ENCOUNTER — Other Ambulatory Visit: Payer: Self-pay

## 2023-07-16 DIAGNOSIS — E785 Hyperlipidemia, unspecified: Secondary | ICD-10-CM

## 2023-07-16 MED ORDER — ROSUVASTATIN CALCIUM 10 MG PO TABS: 10.0000 mg | ORAL_TABLET | Freq: Every day | ORAL | 1 refills | Status: DC

## 2023-08-20 ENCOUNTER — Other Ambulatory Visit: Payer: Self-pay | Admitting: Adult Health

## 2023-08-20 DIAGNOSIS — F339 Major depressive disorder, recurrent, unspecified: Secondary | ICD-10-CM

## 2023-08-20 NOTE — Telephone Encounter (Signed)
 Pharmacy requested refill.  Pended Rx and sent to [facility] for approval due to HIGH ALERT Warning.

## 2023-08-20 NOTE — Telephone Encounter (Signed)
 Will need to discontinue Advil due to interaction with Sertraline.

## 2023-08-27 ENCOUNTER — Other Ambulatory Visit: Payer: Self-pay | Admitting: *Deleted

## 2023-08-27 DIAGNOSIS — K219 Gastro-esophageal reflux disease without esophagitis: Secondary | ICD-10-CM

## 2023-08-27 MED ORDER — PANTOPRAZOLE SODIUM 40 MG PO TBEC
40.0000 mg | DELAYED_RELEASE_TABLET | Freq: Every day | ORAL | 1 refills | Status: DC
Start: 2023-08-27 — End: 2024-01-16

## 2023-08-27 NOTE — Telephone Encounter (Signed)
 Pharmacy requested refill

## 2023-09-05 ENCOUNTER — Other Ambulatory Visit: Payer: Self-pay | Admitting: Adult Health

## 2023-10-29 ENCOUNTER — Other Ambulatory Visit: Payer: Self-pay | Admitting: Adult Health

## 2023-10-29 DIAGNOSIS — I10 Essential (primary) hypertension: Secondary | ICD-10-CM

## 2023-11-22 ENCOUNTER — Other Ambulatory Visit: Payer: Self-pay | Admitting: Adult Health

## 2023-11-22 DIAGNOSIS — F339 Major depressive disorder, recurrent, unspecified: Secondary | ICD-10-CM

## 2023-12-17 ENCOUNTER — Other Ambulatory Visit: Payer: Self-pay | Admitting: Adult Health

## 2023-12-17 DIAGNOSIS — E1169 Type 2 diabetes mellitus with other specified complication: Secondary | ICD-10-CM

## 2023-12-19 ENCOUNTER — Other Ambulatory Visit: Payer: Medicare Other

## 2023-12-19 DIAGNOSIS — E1169 Type 2 diabetes mellitus with other specified complication: Secondary | ICD-10-CM

## 2023-12-19 DIAGNOSIS — E785 Hyperlipidemia, unspecified: Secondary | ICD-10-CM

## 2023-12-20 LAB — COMPREHENSIVE METABOLIC PANEL WITH GFR
AG Ratio: 1.8 (calc) (ref 1.0–2.5)
ALT: 8 U/L — ABNORMAL LOW (ref 9–46)
AST: 12 U/L (ref 10–35)
Albumin: 4.2 g/dL (ref 3.6–5.1)
Alkaline phosphatase (APISO): 52 U/L (ref 35–144)
BUN/Creatinine Ratio: 14 (calc) (ref 6–22)
BUN: 18 mg/dL (ref 7–25)
CO2: 22 mmol/L (ref 20–32)
Calcium: 10.4 mg/dL — ABNORMAL HIGH (ref 8.6–10.3)
Chloride: 102 mmol/L (ref 98–110)
Creat: 1.31 mg/dL — ABNORMAL HIGH (ref 0.70–1.28)
Globulin: 2.4 g/dL (ref 1.9–3.7)
Glucose, Bld: 121 mg/dL — ABNORMAL HIGH (ref 65–99)
Potassium: 4.7 mmol/L (ref 3.5–5.3)
Sodium: 137 mmol/L (ref 135–146)
Total Bilirubin: 0.4 mg/dL (ref 0.2–1.2)
Total Protein: 6.6 g/dL (ref 6.1–8.1)
eGFR: 55 mL/min/1.73m2 — ABNORMAL LOW (ref >=60)

## 2023-12-20 LAB — LIPID PANEL
Cholesterol: 135 mg/dL (ref <200)
HDL: 41 mg/dL (ref >=40)
LDL Cholesterol (Calc): 68 mg/dL
Non-HDL Cholesterol (Calc): 94 mg/dL (ref <130)
Total CHOL/HDL Ratio: 3.3 (calc) (ref <5.0)
Triglycerides: 185 mg/dL — ABNORMAL HIGH (ref <150)

## 2023-12-20 LAB — PTH, INTACT (ICMA) AND IONIZED CALCIUM
Calcium, Ion: 5.8 mg/dL — ABNORMAL HIGH (ref 4.7–5.5)
Calcium: 10.4 mg/dL — ABNORMAL HIGH (ref 8.6–10.3)
PTH: 88 pg/mL — ABNORMAL HIGH (ref 16–77)

## 2023-12-20 LAB — VITAMIN D 25 HYDROXY (VIT D DEFICIENCY, FRACTURES): Vit D, 25-Hydroxy: 42 ng/mL (ref 30–100)

## 2023-12-20 LAB — HEMOGLOBIN A1C
Hgb A1c MFr Bld: 6.9 % — ABNORMAL HIGH (ref <5.7)
Mean Plasma Glucose: 151 mg/dL
eAG (mmol/L): 8.4 mmol/L

## 2023-12-23 ENCOUNTER — Ambulatory Visit: Payer: Self-pay | Admitting: Adult Health

## 2023-12-23 ENCOUNTER — Other Ambulatory Visit: Payer: Self-pay | Admitting: Adult Health

## 2023-12-23 DIAGNOSIS — E213 Hyperparathyroidism, unspecified: Secondary | ICD-10-CM

## 2023-12-23 NOTE — Progress Notes (Signed)
-     PTH 88, elevated, referral to endocrinology sent -   Calcium  10.4, same as last time, elevated -   Triglycerides elevated 185, continue rosuvastatin  -  Cholesterol and LDL normal -   Kidney function stable, same as last time -   A1c 6.9, up from 6.7, continue current metformin  dosage -   Vitamin D  normal

## 2023-12-25 NOTE — Progress Notes (Signed)
 Noted

## 2023-12-27 ENCOUNTER — Encounter: Payer: Self-pay | Admitting: Adult Health

## 2023-12-27 ENCOUNTER — Ambulatory Visit: Payer: Medicare Other | Admitting: Adult Health

## 2023-12-27 VITALS — BP 110/68 | HR 84 | Temp 97.2°F | Resp 18 | Ht 71.0 in | Wt 181.0 lb

## 2023-12-27 DIAGNOSIS — N401 Enlarged prostate with lower urinary tract symptoms: Secondary | ICD-10-CM

## 2023-12-27 DIAGNOSIS — F339 Major depressive disorder, recurrent, unspecified: Secondary | ICD-10-CM | POA: Diagnosis not present

## 2023-12-27 DIAGNOSIS — I10 Essential (primary) hypertension: Secondary | ICD-10-CM | POA: Diagnosis not present

## 2023-12-27 DIAGNOSIS — H9193 Unspecified hearing loss, bilateral: Secondary | ICD-10-CM

## 2023-12-27 DIAGNOSIS — K219 Gastro-esophageal reflux disease without esophagitis: Secondary | ICD-10-CM

## 2023-12-27 DIAGNOSIS — E213 Hyperparathyroidism, unspecified: Secondary | ICD-10-CM | POA: Diagnosis not present

## 2023-12-27 DIAGNOSIS — E1122 Type 2 diabetes mellitus with diabetic chronic kidney disease: Secondary | ICD-10-CM

## 2023-12-27 DIAGNOSIS — R350 Frequency of micturition: Secondary | ICD-10-CM

## 2023-12-27 DIAGNOSIS — N1831 Chronic kidney disease, stage 3a: Secondary | ICD-10-CM

## 2023-12-27 DIAGNOSIS — E781 Pure hyperglyceridemia: Secondary | ICD-10-CM

## 2023-12-27 NOTE — Progress Notes (Signed)
 [facility] clinic  Provider:  Jereld Serum DNP  Code Status:  DNR  Goals of Care:     06/28/2023    9:33 AM  Advanced Directives  Does Patient Have a Medical Advance Directive? Yes  Type of Advance Directive Living will;Out of facility DNR (pink MOST or yellow form)  Does patient want to make changes to medical advance directive? No - Patient declined     Chief Complaint  Patient presents with   Medical Management of Chronic Issues     6 mths   Discussed the use of AI scribe software for clinical note transcription with the patient, who gave verbal consent to proceed.    HPI: Patient is a 78 y.o. male seen today for a 24-month follow up of chronic medical issues.  He has hyperparathyroidism. He has been in contact with the endocrinologist's office but has not yet had the appointment.  He experiences frequent urination, going two to three times a night, and takes tamsulosin  0.4 mg daily for benign prostatic hyperplasia (BPH). He had a procedure done by a urologist a few years ago but continues to experience frequent urination. He does not currently follow up with a urologist.  He has type 2 diabetes with an A1c of 6.9, managed with metformin  1000 mg twice daily. He does not check his blood sugar at home and does not engage in regular exercise. His diet is low in vegetables and high in sweets, though he reports eating small portions overall.  He experiences depression and takes sertraline  50 mg daily. He has expressed feelings of fatigue throughout the day and a lack of interest in prolonging his life, though he denies any current plans to harm himself.  He has hypertriglyceridemia with triglycerides at 185 mg/dL and takes rosuvastatin  10 mg daily. His cholesterol and LDL levels are within normal limits.  He has a history of hypertension, managed with ramipril  5 mg daily. He also takes pantoprazole  40 mg daily for GERD and does not notice reflux symptoms anymore.  He has a  history of cataract surgery on the right eye in February and is scheduled for cataract surgery on the left eye in three weeks. He experienced complications after the previous surgery but did not specify the nature of these complications.  He has a history of hearing loss in both ears but dislikes wearing hearing aids. No current pain and has a DNR order in place.      Past Medical History:  Diagnosis Date   Allergy    Anemia    Anxiety    Depression    Diabetes mellitus without complication (HCC)    GERD (gastroesophageal reflux disease)    History of kidney stones 1971   Hyperlipidemia    Hypertension    Sleep apnea     Past Surgical History:  Procedure Laterality Date   APPENDECTOMY  1952   CYSTOSCOPY N/A 01/09/2023   Procedure: CYSTOSCOPY;  Surgeon: Devere Lonni Righter, MD;  Location: [facility];  Service: Urology;  Laterality: N/A;   ENTEROSCOPY N/A 11/16/2021   Procedure: ENTEROSCOPY;  Surgeon: Stacia Glendia BRAVO, MD;  Location: [facility];  Service: Gastroenterology;  Laterality: N/A;   HEMOSTASIS CLIP PLACEMENT  11/16/2021   Procedure: HEMOSTASIS CLIP PLACEMENT;  Surgeon: Stacia Glendia BRAVO, MD;  Location: [facility];  Service: Gastroenterology;;   HOT HEMOSTASIS N/A 11/16/2021   Procedure: HOT HEMOSTASIS (ARGON PLASMA COAGULATION/BICAP);  Surgeon: Stacia Glendia BRAVO, MD;  Location: [facility];  Service: Gastroenterology;  Laterality: N/A;  LUMBAR DISC SURGERY     POLYPECTOMY  11/16/2021   Procedure: POLYPECTOMY;  Surgeon: Stacia Glendia BRAVO, MD;  Location: [facility];  Service: Gastroenterology;;   ROBLEY LIFTING INJECTION  11/16/2021   Procedure: SUBMUCOSAL LIFTING INJECTION;  Surgeon: Stacia Glendia BRAVO, MD;  Location: [facility];  Service: Gastroenterology;;    No Known Allergies  Outpatient Encounter Medications as of 12/27/2023  Medication Sig   metFORMIN  (GLUCOPHAGE ) 1000 MG tablet TAKE 1 TABLET(1000 MG) BY MOUTH TWICE DAILY   pantoprazole   (PROTONIX ) 40 MG tablet Take 1 tablet (40 mg total) by mouth daily.   ramipril  (ALTACE ) 5 MG capsule TAKE 1 CAPSULE(5 MG) BY MOUTH DAILY   rosuvastatin  (CRESTOR ) 10 MG tablet Take 1 tablet (10 mg total) by mouth daily.   sertraline  (ZOLOFT ) 50 MG tablet TAKE 1 TABLET(50 MG) BY MOUTH DAILY   tamsulosin  (FLOMAX ) 0.4 MG CAPS capsule Take 1 capsule (0.4 mg total) by mouth at bedtime.   [DISCONTINUED] ferrous sulfate 325 (65 FE) MG EC tablet Take 325 mg by mouth daily.   No facility-administered encounter medications on file as of 12/27/2023.    Review of Systems:  Review of Systems  Constitutional:  Positive for fatigue. Negative for activity change, appetite change and fever.  HENT:  Negative for sore throat.   Eyes: Negative.   Cardiovascular:  Negative for chest pain and leg swelling.  Gastrointestinal:  Negative for abdominal distention, diarrhea and vomiting.  Genitourinary:  Negative for dysuria, frequency and urgency.  Skin:  Negative for color change.  Neurological:  Negative for dizziness and headaches.  Psychiatric/Behavioral:  Negative for behavioral problems and sleep disturbance. The patient is not nervous/anxious.     Health Maintenance  Topic Date Due   Medicare Annual Wellness (AWV)  01/03/2024   INFLUENZA VACCINE  01/10/2024   COVID-19 Vaccine (11 - 2024-25 season) 03/18/2024   HEMOGLOBIN A1C  06/20/2024   Diabetic kidney evaluation - Urine ACR  06/27/2024   OPHTHALMOLOGY EXAM  07/25/2024   Colonoscopy  10/25/2024   Diabetic kidney evaluation - eGFR measurement  12/18/2024   FOOT EXAM  12/26/2024   Hepatitis C Screening  Completed   Hepatitis B Vaccines  Aged Out   HPV VACCINES  Aged Out   Meningococcal B Vaccine  Aged Out   DTaP/Tdap/Td  Discontinued   Pneumococcal Vaccine: 50+ Years  Discontinued   Zoster Vaccines- Shingrix  Discontinued    Physical Exam: Vitals:   12/27/23 0925  BP: 110/68  Pulse: 84  Resp: 18  Temp: (!) 97.2 F (36.2 C)  SpO2: 90%   Weight: 181 lb (82.1 kg)  Height: 5' 11 (1.803 m)   Body mass index is 25.24 kg/m. Physical Exam Constitutional:      Appearance: Normal appearance.  HENT:     Head: Normocephalic and atraumatic.     Mouth/Throat:     Mouth: Mucous membranes are moist.  Eyes:     Conjunctiva/sclera: Conjunctivae normal.  Cardiovascular:     Rate and Rhythm: Normal rate and regular rhythm.     Pulses: Normal pulses.     Heart sounds: Normal heart sounds.  Pulmonary:     Effort: Pulmonary effort is normal.     Breath sounds: Normal breath sounds.  Abdominal:     General: Bowel sounds are normal.     Palpations: Abdomen is soft.  Musculoskeletal:        General: No swelling. Normal range of motion.     Cervical back: Normal range of  motion.  Skin:    General: Skin is warm and dry.     Comments: Long bilateral feet toenails.  Neurological:     General: No focal deficit present.     Mental Status: He is alert and oriented to person, place, and time.  Psychiatric:        Mood and Affect: Mood normal.        Behavior: Behavior normal.        Thought Content: Thought content normal.        Judgment: Judgment normal.     Labs reviewed: Basic Metabolic Panel: Recent Labs    06/28/23 1038 12/19/23 0845  NA 138 137  K 5.0 4.7  CL 103 102  CO2 29 22  GLUCOSE 132 121*  BUN 19 18  CREATININE 1.35* 1.31*  CALCIUM  11.0* 10.4*  10.4*   Liver Function Tests: Recent Labs    06/28/23 1038 12/19/23 0845  AST 14 12  ALT 12 8*  BILITOT 0.4 0.4  PROT 6.8 6.6   No results for input(s): LIPASE, AMYLASE in the last 8760 hours. No results for input(s): AMMONIA in the last 8760 hours. CBC: Recent Labs    06/28/23 1038  WBC 7.9  NEUTROABS 5,159  HGB 13.6  HCT 42.5  MCV 91.6  PLT 254   Lipid Panel: Recent Labs    12/19/23 0845  CHOL 135  HDL 41  LDLCALC 68  TRIG 185*  CHOLHDL 3.3   Lab Results  Component Value Date   HGBA1C 6.9 (H) 12/19/2023    Procedures since  last visit: No results found.  Assessment/Plan  1. Hyperparathyroidism (HCC) (Primary) -  Elevated calcium  and PTH levels suggest hyperparathyroidism. Referred to endocrinologist for further evaluation and management.  2. Type 2 diabetes mellitus with stage 3a chronic kidney disease, without long-term current use of insulin  (HCC) -  A1c is 6.9%, indicating well-controlled diabetes. He is on metformin  1000 mg twice daily. Discussed exercise benefits for health and mood. - Continue metformin  1000 mg twice daily. - Encourage exercise for overall health benefits. - Hemoglobin A1C; Future  3. Primary hypertension -  Blood pressure well-controlled at 110/68 mmHg on ramipril  5 mg daily. - Continue ramipril  5 mg daily. - Do not attempt resuscitation (DNR) - Comprehensive metabolic panel; Future  4. Major depression, recurrent, chronic (HCC) -  He is on sertraline  50 mg daily. Discussed exercise benefits for depression. Denied current suicidal ideation. Personal situation may impact mood. - Continue sertraline  50 mg daily. - Encourage exercise for potential mood improvement. - CBC with Differential/Platelets; Future  5. Hypertriglyceridemia -  Triglycerides elevated at 185 mg/dL. Cholesterol and LDL normal. Diet includes limited vegetables and frequent sweets. - Continue rosuvastatin  10 mg daily. - Encourage dietary modifications to include more vegetables and reduce sweets. - Lipid panel; Future  6. Gastroesophageal reflux disease without esophagitis -  On pantoprazole  40 mg daily. - Continue pantoprazole  40 mg daily.  7. Benign prostatic hyperplasia with urinary frequency -  Reports frequent urination, 2-3 times per night. On tamsulosin  0.4 mg daily. Not currently concerned about symptoms. - Continue tamsulosin  0.4 mg twice daily. - PSA; Future  8. Bilateral hearing loss, unspecified hearing loss type -  He has hearing loss in both ears but dislikes wearing hearing aids.  Goals  of Care He has a DNR order and expressed a desire not to prolong life unnecessarily. Discussed importance of having a copy of the DNR available. - Bring a copy of the DNR  to the next visit.  Follow-up Scheduled for an annual wellness visit next week. Discussed need for routine follow-up and blood work prior to next visit. - Schedule annual wellness visit for next week. - Order blood work (CBC, A1c, CMP, lipid panel, PSA) one week before the next visit in six months.    Labs/tests ordered:  CBC, CMP, lipid panel, A1C, PSA   Return in about 6 months (around 06/28/2024).  Calix Heinbaugh Medina-Vargas, NP

## 2024-01-04 NOTE — Progress Notes (Signed)
 [facility]  Urgent Care Provider Note   Provider at bedside: 2:36 PM  History obtained from the: Patient  HISTORY   PATIENT ID: Bryan Stafford is a 78 y.o. male.  CHIEF COMPLAINT: Chief Complaint  Patient presents with  . Fall    Pt tripped this morning at 11:00 am, he caught his foot on the carpet out the front door and hit his head on the concrete outside. He has an abrasion left eye brow. Left arm, and 3rd digit.      ALLERGIES: Allergies[1]   PAST MEDICAL HISTORY: PMH - Calcium  blood increased Diabetes mellitus    (CMD)  CURRENT MEDICATIONS: Current Medications[2]  ROS  All other symptoms are reviewed and are negative except those listed in HPI   HPI   Bryan Stafford is a 78 y.o. male  presents to Urgent care   History of Present Illness The patient is a male with a history of elevated calcium  levels presenting with cuts from a fall.  Fall on 01/04/2024 - Patient fell at 11 AM, stumbling over his front porch. - Injuries to arm and finger from breaking fall with hands. - Reports dizziness and lightheadedness after standing. - No headache, neck pain, back pain, or other bruises. - Unsure of last tetanus shot.  Elevated Calcium  Levels - Semiannual checkup on 12/27/2023 revealed elevated calcium  levels. - Saw physician at [facility] on 12/31/2023. - Prescribed medication for condition, not yet obtained. - Chose medication over outpatient surgery for parathyroid.  PHYSICAL EXAM   Vitals:   01/04/24 1418  BP: 104/66  Pulse: 74  Resp: 18  Temp: 99.2 F (37.3 C)  TempSrc: Tympanic  SpO2: 98%  Weight: 81.6 kg (180 lb)  Height: 1.803 m (5' 11)     Physical Exam Vitals and nursing note reviewed.  Constitutional:      General: He is not in acute distress.    Appearance: Normal appearance. He is not ill-appearing, toxic-appearing or diaphoretic.  HENT:     Head: Normocephalic.     Comments: No step off  deformity- skin lac above left brow described in skin exam    Right Ear: External ear normal.     Left Ear: External ear normal.     Nose: Nose normal.     Mouth/Throat:     Mouth: Mucous membranes are moist.   Eyes:     Extraocular Movements: Extraocular movements intact.     Pupils: Pupils are equal, round, and reactive to light.    Cardiovascular:     Rate and Rhythm: Normal rate and regular rhythm.  Pulmonary:     Effort: Pulmonary effort is normal. No respiratory distress.  Abdominal:     General: Bowel sounds are normal. There is no distension.     Palpations: Abdomen is soft.     Tenderness: There is no abdominal tenderness. There is no right CVA tenderness or left CVA tenderness.   Musculoskeletal:        General: Swelling, tenderness and signs of injury present.     Cervical back: Neck supple.     Comments: No midline spine ttp cervical, thoracic or lumbar   Skin:    General: Skin is warm and dry.     Capillary Refill: Capillary refill takes less than 2 seconds.     Findings: Lesion present.     Comments: Superficial laceration, ragged to left upper brow above his eyebrow. 3 cm- no active bleeding No step  off bone deformity  1 cm superficial abrasion to right 3rd finger at dorsal dip no active bleeding- no gross swelling no nvdd  3x1 cm superficial abrasion to left dorsal forearm. no bruising   Neurological:     General: No focal deficit present.     Mental Status: He is alert and oriented to person, place, and time.     Motor: No weakness.     Gait: Gait normal.   Psychiatric:        Mood and Affect: Mood normal.      Laceration repair  Date/Time: 01/04/2024 2:30 PM  Performed by: Channing Macario Sero, FNP Authorized by: Channing Macario Sero, FNP   Consent:    Consent obtained:  Verbal   Consent given by:  Patient   Risks discussed:  Infection, pain and poor cosmetic result   Alternatives discussed:  No treatment Universal protocol:    Procedure explained  and questions answered to patient or proxy's satisfaction: yes     Patient identity confirmed:  Verbally with patient Anesthesia:    Anesthesia method:  Topical application   Topical anesthetic:  LET Laceration details:    Location:  Face   Face location:  L eyebrow   Length (cm):  3   Depth (mm):  2 Pre-procedure details:    Preparation:  Patient was prepped and draped in usual sterile fashion Exploration:    Limited defect created (wound extended): no     Hemostasis achieved with:  LET Laceration repair  Date/Time: 01/04/2024 2:30 PM  Performed by: Channing Macario Sero, FNP Authorized by: Channing Macario Sero, FNP   Consent:    Consent obtained:  Verbal   Consent given by:  Patient   Risks discussed:  Pain, infection, need for additional repair, poor cosmetic result and poor wound healing Universal protocol:    Procedure explained and questions answered to patient or proxy's satisfaction: yes     Patient identity confirmed:  Verbally with patient Anesthesia:    Anesthesia method:  Topical application Laceration details:    Location:  Shoulder/arm   Shoulder/arm location:  L lower arm   Wound length (cm): 3x1.   Depth (mm):  1 Pre-procedure details:    Preparation:  Patient was prepped and draped in usual sterile fashion Exploration:    Limited defect created (wound extended): yes     Imaging outcome: foreign body not noted     Contaminated: no   Treatment:    Area cleansed with:  Saline   Amount of cleaning:  Standard   Irrigation solution:  Sterile saline   Irrigation volume:  60   Irrigation method:  Tap   Visualized foreign bodies/material removed: no   Skin repair:    Repair method:  Tissue adhesive Approximation:    Approximation:  Close Repair type:    Repair type:  Simple Post-procedure details:    Dressing:  Open (no dressing)   Procedure completion:  Tolerated well, no immediate complications     RESULTS  No results found for this visit on  01/04/24.   ASSESSMENT/PLAN/MDM   1. Injury of head, initial encounter   2. Facial laceration, initial encounter   3. Abrasion   4. Fall, initial encounter      Bryan Stafford is a 78 y.o. male  presents to Urgent care discussed mechanical fall causing superficial abrasions to his right middle finger at the DIP dorsal aspect, no joint swelling full range of motion no N/V DD and left dorsal forearm  3 cm x 1 cm, no active bleeding.  There was also a superficial laceration to the left brow, no active bleeding.  Patient had no loss of consciousness and denies current headache.  There was no midline spine tenderness cervical, thoracic or lumbar.  No neurologic deficits are identified.  Patient is ambulatory without noted imbalance.  Wounds were cleansed thoroughly and closed with Dermabond as requested by patient.  His tetanus was updated.  Instructions given for wound care.  We also discussed concern for head injury at his age and concern for fragile vasculature and possible intracranial hemorrhage.  Patient states he is a DNR and is not interested now in going for head CT.  He has several family members that are physicians and will discuss with them before doing any further medical care      UC DISPOSITION   Follow up with PCP  Patient Instructions  Take Tylenol  for pain if needed.  Watch for concerning signs of head injury including increased headache, visual changes, dizziness or nausea vomiting and go to the ER for CT scan.  Your wounds were closed today with Dermabond.  Do your best to not get them wet it will take about 10 days for them to fully heal.  Tetanus was updated today.    Hand out provided, I discussed the findings today, diagnosis/differential diagnosis, plan and red flags that require return for reevaluation with PCP,  Urgent care or EMERGENCY. Patient/representitive was agreeable to outlined plan. Questions were answered and patient is stable for discharge.  Provider  time spent in patient care today, inclusive of but not limited to clinical reassessment, review of diagnostic studies, and discharge preparation, was less than 30 minutes.  This document was created using the aid of voice recognition Scientist, clinical (histocompatibility and immunogenetics).  Electrically signed by Channing Sero ENP-C MSN at 4:01 PM        [1] No Known Allergies [2] .  metFORMIN  (GLUCOPHAGE ) 1,000 mg tablet .  ramipriL  (ALTACE ) 5 mg capsule .  rosuvastatin  (CRESTOR ) 10 mg tablet .  sertraline  (ZOLOFT ) 50 mg tablet .  tamsulosin  (FLOMAX ) 0.4 mg cap .  pantoprazole  (PROTONIX ) 40 mg EC tablet No current facility-administered medications for this visit.

## 2024-01-06 ENCOUNTER — Ambulatory Visit (INDEPENDENT_AMBULATORY_CARE_PROVIDER_SITE_OTHER): Payer: Medicare Other | Admitting: Adult Health

## 2024-01-06 ENCOUNTER — Encounter: Payer: Self-pay | Admitting: Adult Health

## 2024-01-06 VITALS — BP 127/68 | HR 79 | Temp 97.2°F | Resp 18 | Ht 71.0 in | Wt 180.0 lb

## 2024-01-06 DIAGNOSIS — Z0000 Encounter for general adult medical examination without abnormal findings: Secondary | ICD-10-CM | POA: Diagnosis not present

## 2024-01-06 NOTE — Progress Notes (Signed)
 Subjective:   Bryan Stafford is a 78 y.o. male who presents for Medicare Annual/Subsequent preventive examination.  Visit Complete: In person  Patient Medicare AWV questionnaire was completed by the patient on 01/06/24; I have confirmed that all information answered by patient is correct and no changes since this date.  Cardiac Risk Factors include: advanced age (>37men, >85 women);diabetes mellitus;dyslipidemia;family history of premature cardiovascular disease;hypertension;male gender;sedentary lifestyle     Objective:    Today's Vitals   01/06/24 0914 01/06/24 0916  BP: 127/68   Pulse: 79   Resp: 18   Temp: (!) 97.2 F (36.2 C)   SpO2: 96%   Weight: 180 lb (81.6 kg)   Height: 5' 11 (1.803 m)   PainSc:  1    Body mass index is 25.1 kg/m.     01/06/2024    9:03 AM 06/28/2023    9:33 AM 01/03/2023    9:53 AM 12/31/2022    9:05 AM 03/06/2022    1:09 PM 11/16/2021    9:56 AM 07/19/2021    3:40 PM  Advanced Directives  Does Patient Have a Medical Advance Directive? Yes Yes No Yes No Yes Yes  Type of Advance Directive Out of facility DNR (pink MOST or yellow form) Living will;Out of facility DNR (pink MOST or yellow form)  Living will;Healthcare Power of [employer] of [place];Living will Healthcare Power of [place];Out of facility DNR (pink MOST or yellow form)  Does patient want to make changes to medical advance directive? No - Patient declined No - Patient declined     No - Patient declined  Copy of Healthcare Power of Attorney in Chart?    No - copy requested  No - copy requested No - copy requested  Would patient like information on creating a medical advance directive?   No - Patient declined      Pre-existing out of facility DNR order (yellow form or pink MOST form) Yellow form placed in chart (order not valid for inpatient use)          Current Medications (verified) Outpatient Encounter Medications as of 01/06/2024  Medication Sig   metFORMIN  (GLUCOPHAGE )  1000 MG tablet TAKE 1 TABLET(1000 MG) BY MOUTH TWICE DAILY   pantoprazole  (PROTONIX ) 40 MG tablet Take 1 tablet (40 mg total) by mouth daily.   ramipril  (ALTACE ) 5 MG capsule TAKE 1 CAPSULE(5 MG) BY MOUTH DAILY   rosuvastatin  (CRESTOR ) 10 MG tablet Take 1 tablet (10 mg total) by mouth daily.   sertraline  (ZOLOFT ) 50 MG tablet TAKE 1 TABLET(50 MG) BY MOUTH DAILY   tamsulosin  (FLOMAX ) 0.4 MG CAPS capsule Take 1 capsule (0.4 mg total) by mouth at bedtime.   No facility-administered encounter medications on file as of 01/06/2024.    Allergies (verified) Patient has no known allergies.   History: Past Medical History:  Diagnosis Date   Allergy    Anemia    Anxiety    Depression    Diabetes mellitus without complication (HCC)    GERD (gastroesophageal reflux disease)    History of kidney stones 1971   Hyperlipidemia    Hypertension    Sleep apnea    Past Surgical History:  Procedure Laterality Date   APPENDECTOMY  1952   CYSTOSCOPY N/A 01/09/2023   Procedure: CYSTOSCOPY;  Surgeon: Devere Lonni Righter, MD;  Location: [facility];  Service: Urology;  Laterality: N/A;   ENTEROSCOPY N/A 11/16/2021   Procedure: ENTEROSCOPY;  Surgeon: Stacia Hamilton BRAVO, MD;  Location: [facility];  Service: Gastroenterology;  Laterality: N/A;   HEMOSTASIS CLIP PLACEMENT  11/16/2021   Procedure: HEMOSTASIS CLIP PLACEMENT;  Surgeon: Stacia Glendia BRAVO, MD;  Location: [facility];  Service: Gastroenterology;;   HOT HEMOSTASIS N/A 11/16/2021   Procedure: HOT HEMOSTASIS (ARGON PLASMA COAGULATION/BICAP);  Surgeon: Stacia Glendia BRAVO, MD;  Location: [facility];  Service: Gastroenterology;  Laterality: N/A;   LUMBAR DISC SURGERY     POLYPECTOMY  11/16/2021   Procedure: POLYPECTOMY;  Surgeon: Stacia Glendia BRAVO, MD;  Location: [facility];  Service: Gastroenterology;;   ROBLEY LIFTING INJECTION  11/16/2021   Procedure: SUBMUCOSAL LIFTING INJECTION;  Surgeon: Stacia Glendia BRAVO, MD;  Location: [facility];   Service: Gastroenterology;;   Family History  Problem Relation Age of Onset   Heart failure Mother    Hypertension Mother    Hyperlipidemia Mother    Heart disease Father    Hypertension Father    Hyperlipidemia Father    Sleep apnea Sister    COPD Sister    Colon cancer Neg Hx    Esophageal cancer Neg Hx    Stomach cancer Neg Hx    Rectal cancer Neg Hx    Social History   Socioeconomic History   Marital status: Married    Spouse name: Not on file   Number of children: 2   Years of education: Not on file   Highest education level: Master's degree (e.g., MA, MS, MEng, MEd, MSW, MBA)  Occupational History   Occupation: Controller    Comment: Clinical research associate   Occupation: retired  Tobacco Use   Smoking status: Never    Passive exposure: Never   Smokeless tobacco: Never  Vaping Use   Vaping status: Never Used  Substance and Sexual Activity   Alcohol use: Yes    Alcohol/week: 2.0 standard drinks of alcohol    Types: 2 Standard drinks or equivalent per week    Comment: wine occasional   Drug use: Never   Sexual activity: Not on file  Other Topics Concern   Not on file  Social History Narrative   Do you drink/eat things with caffeine:Yes Coffee and Tea   What year were you married? 1973   Do you live in a house, apartment, assisted living, condo, trailer, etc.? Duplex   Is it one or more stories? One   How many persons live in your home? 2- Wife and myself    Do you exercise? No                               Do you have a DNR form? No If not, do you want to discuss one? Yes   Do you have any difficulty bathing or dressing yourself? No   Do you have difficulty preparing food or eating?No   Do you have difficulty managing your medications?No   Do you have any difficulty managing your finances? No   Do you have any difficulty affording your medications? No          Social Drivers of Corporate investment banker Strain: Low Risk  (12/19/2022)    Overall Financial Resource Strain (CARDIA)    Difficulty of Paying Living Expenses: Not hard at all  Food Insecurity: No Food Insecurity (01/03/2023)   Hunger Vital Sign    Worried About Running Out of Food in the Last Year: Never true    Ran Out of Food in the Last Year: Never true  Transportation Needs: No Transportation Needs (12/19/2022)   PRAPARE - Administrator, Civil Service (Medical): No    Lack of Transportation (Non-Medical): No  Physical Activity: Unknown (12/19/2022)   Exercise Vital Sign    Days of Exercise per Week: 0 days    Minutes of Exercise per Session: Not on file  Stress: No Stress Concern Present (12/19/2022)   [employer] of Occupational Health - Occupational Stress Questionnaire    Feeling of Stress : Only a little  Social Connections: Socially Integrated (12/19/2022)   Social Connection and Isolation Panel    Frequency of Communication with Friends and Family: Twice a week    Frequency of Social Gatherings with Friends and Family: Once a week    Attends Religious Services: More than 4 times per year    Active Member of [employer] or Organizations: Yes    Attends Engineer, structural: More than 4 times per year    Marital Status: Married    Tobacco Counseling Counseling given: Not Answered   Clinical Intake:  Pre-visit preparation completed: No  Pain : 0-10 Pain Score: 1  Pain Location: Other (Comment) (right middle finger) Pain Orientation: Right Pain Descriptors / Indicators: Sore Pain Onset: In the past 7 days Pain Frequency: Constant Pain Relieving Factors: None Effect of Pain on Daily Activities: none  Pain Relieving Factors: None  BMI - recorded: 25.24 Nutritional Status: BMI 25 -29 Overweight Diabetes: Yes CBG done?: No Did pt. bring in CBG monitor from home?: No  How often do you need to have someone help you when you read instructions, pamphlets, or other written materials from your doctor or pharmacy?: 1 -  Never What is the last grade level you completed in school?: Masters degree in Accounting  Interpreter Needed?: No  Information entered by :: Harvy Riera Medina-Vargas DNP   Activities of Daily Living    01/06/2024    9:27 AM  In your present state of health, do you have any difficulty performing the following activities:  Hearing? 1  Vision? 1  Comment has left eye with cataracts  Difficulty concentrating or making decisions? 1  Walking or climbing stairs? 0  Dressing or bathing? 0  Doing errands, shopping? 0  Preparing Food and eating ? N  Using the Toilet? N  In the past six months, have you accidently leaked urine? N  Do you have problems with loss of bowel control? N  Managing your Medications? N  Managing your Finances? N  Housekeeping or managing your Housekeeping? N    Patient Care Team: Medina-Vargas, Cherene Dobbins C, NP as PCP - General (Internal Medicine)  Indicate any recent Medical Services you may have received from other than [facility] providers in the past year (date may be approximate).     Assessment:   This is a routine wellness examination for Aharon.  Hearing/Vision screen Hearing Screening - Comments:: Patient does wear hearing aids but does has hard of hearing Vision Screening - Comments:: Patient does wear glasses   Goals Addressed             This Visit's Progress    DIET - INCREASE WATER INTAKE       Will drink 8 cups in a day       Depression Screen    01/06/2024    9:12 AM 06/28/2023    9:33 AM 01/03/2023    9:52 AM  PHQ 2/9 Scores  PHQ - 2 Score 0 0 0  PHQ- 9 Score 0  0    Fall Risk    01/06/2024    9:10 AM 06/28/2023    9:32 AM 01/03/2023    9:51 AM 01/02/2023    4:44 PM 11/01/2021    9:39 AM  Fall Risk   Falls in the past year? 1 0 0 0 0  Number falls in past yr: 1 0 0 0 0  Injury with Fall? 1 0 0 0 0  Risk for fall due to : History of fall(s) No Fall Risks No Fall Risks  No Fall Risks  Follow up  Falls evaluation completed Falls  evaluation completed  Falls evaluation completed      Data saved with a previous flowsheet row definition    MEDICARE RISK AT HOME:    TIMED UP AND GO:  Was the test performed?  Yes  Length of time to ambulate 10 feet: <10 sec sec Gait steady and fast without use of assistive device    Cognitive Function:    01/03/2023    9:54 AM  MMSE - Mini Mental State Exam  Orientation to time 5  Orientation to Place 5  Registration 3  Attention/ Calculation 5  Recall 3  Language- name 2 objects 2  Language- repeat 1  Language- follow 3 step command 3  Language- read & follow direction 1  Write a sentence 1  Copy design 1  Total score 30        01/06/2024    9:06 AM  6CIT Screen  What Year? 0 points  What month? 0 points  What time? 0 points  Count back from 20 0 points  Months in reverse 0 points  Repeat phrase 0 points  Total Score 0 points    Immunizations Immunization History  Administered Date(s) Administered   Influenza-Unspecified 06/11/2020   MODERNA COVID-19 SARS-COV-2 PEDS BIVALENT BOOSTER 68yr-71yr 07/23/2019   Moderna Covid-19 Vaccine Bivalent Booster 27yrs & up 06/25/2019, 07/25/2019, 04/18/2020, 01/01/2022, 04/03/2022, 09/19/2022   Moderna SARS-COV2 Booster Vaccination 02/21/2020, 10/21/2020   Pfizer(Comirnaty)Fall Seasonal Vaccine 12 years and older 09/17/2023   Tdap 01/04/2024    TDAP status: Up to date  Flu Vaccine status: Declined, Education has been provided regarding the importance of this vaccine but patient still declined. Advised may receive this vaccine at local pharmacy or Health Dept. Aware to provide a copy of the vaccination record if obtained from local pharmacy or Health Dept. Verbalized acceptance and understanding.  Pneumococcal vaccine status: Declined,  Education has been provided regarding the importance of this vaccine but patient still declined. Advised may receive this vaccine at local pharmacy or Health Dept. Aware to provide a copy  of the vaccination record if obtained from local pharmacy or Health Dept. Verbalized acceptance and understanding.   Covid-19 vaccine status: Completed vaccines  Qualifies for Shingles Vaccine? Yes   Zostavax completed No   Shingrix Completed?: No.    Education has been provided regarding the importance of this vaccine. Patient has been advised to call insurance company to determine out of pocket expense if they have not yet received this vaccine. Advised may also receive vaccine at local pharmacy or Health Dept. Verbalized acceptance and understanding.  Screening Tests Health Maintenance  Topic Date Due   INFLUENZA VACCINE  01/10/2024   COVID-19 Vaccine (11 - 2024-25 season) 03/18/2024   HEMOGLOBIN A1C  06/20/2024   Diabetic kidney evaluation - Urine ACR  06/27/2024   OPHTHALMOLOGY EXAM  07/25/2024   Colonoscopy  10/25/2024   Diabetic kidney evaluation - eGFR  measurement  12/18/2024   FOOT EXAM  12/26/2024   Medicare Annual Wellness (AWV)  01/05/2025   Hepatitis C Screening  Completed   Hepatitis B Vaccines  Aged Out   HPV VACCINES  Aged Out   Meningococcal B Vaccine  Aged Out   DTaP/Tdap/Td  Discontinued   Pneumococcal Vaccine: 50+ Years  Discontinued   Zoster Vaccines- Shingrix  Discontinued    Health Maintenance  There are no preventive care reminders to display for this patient.   Colorectal cancer screening: Type of screening: Colonoscopy. Completed 10/25/21. Repeat every 3 years  Lung Cancer Screening: (Low Dose CT Chest recommended if Age 37-80 years, 20 pack-year currently smoking OR have quit w/in 15years.) does not qualify.   Lung Cancer Screening Referral: N/A  Additional Screening:  Hepatitis C Screening: does qualify; Completed 06/28/23  Vision Screening: Recommended annual ophthalmology exams for early detection of glaucoma and other disorders of the eye. Is the patient up to date with their annual eye exam?  Yes  Who is the provider or what is the name of  the office in which the patient attends annual eye exams? Dr. Evalene King If pt is not established with a provider, would they like to be referred to a provider to establish care? No .   Dental Screening: Recommended annual dental exams for proper oral hygiene  Diabetic Foot Exam: Diabetic Foot Exam: Completed 12/27/23  Community Resource Referral / Chronic Care Management: CRR required this visit?  No   CCM required this visit?  No     Plan:     I have personally reviewed and noted the following in the patient's chart:   Medical and social history Use of alcohol, tobacco or illicit drugs  Current medications and supplements including opioid prescriptions. Patient is not currently taking opioid prescriptions. Functional ability and status Nutritional status Physical activity Advanced directives List of other physicians Hospitalizations, surgeries, and ER visits in previous 12 months Vitals Screenings to include cognitive, depression, and falls Referrals and appointments  In addition, I have reviewed and discussed with patient certain preventive protocols, quality metrics, and best practice recommendations. A written personalized care plan for preventive services as well as general preventive health recommendations were provided to patient.     Annelies Coyt Medina-Vargas, NP   01/06/2024   After Visit Summary: (In Person-Printed) AVS printed and given to the patient  Nurse Notes: needs annual wellness visit schedule.

## 2024-01-06 NOTE — Patient Instructions (Signed)
  Bryan Stafford , Thank you for taking time to come for your Medicare Wellness Visit. I appreciate your ongoing commitment to your health goals. Please review the following plan we discussed and let me know if I can assist you in the future.   These are the goals we discussed:  Goals      DIET - INCREASE WATER INTAKE     Will drink 8 cups in a day        This is a list of the screening recommended for you and due dates:  Health Maintenance  Topic Date Due   Flu Shot  01/10/2024   COVID-19 Vaccine (11 - 2024-25 season) 03/18/2024   Hemoglobin A1C  06/20/2024   Yearly kidney health urinalysis for diabetes  06/27/2024   Eye exam for diabetics  07/25/2024   Colon Cancer Screening  10/25/2024   Yearly kidney function blood test for diabetes  12/18/2024   Complete foot exam   12/26/2024   Medicare Annual Wellness Visit  01/05/2025   Hepatitis C Screening  Completed   Hepatitis B Vaccine  Aged Out   HPV Vaccine  Aged Out   Meningitis B Vaccine  Aged Out   DTaP/Tdap/Td vaccine  Discontinued   Pneumococcal Vaccine for age over 48  Discontinued   Zoster (Shingles) Vaccine  Discontinued

## 2024-01-16 ENCOUNTER — Other Ambulatory Visit: Payer: Self-pay | Admitting: Adult Health

## 2024-01-16 DIAGNOSIS — K219 Gastro-esophageal reflux disease without esophagitis: Secondary | ICD-10-CM

## 2024-01-16 DIAGNOSIS — E785 Hyperlipidemia, unspecified: Secondary | ICD-10-CM

## 2024-02-28 ENCOUNTER — Other Ambulatory Visit: Payer: Self-pay | Admitting: Adult Health

## 2024-02-28 DIAGNOSIS — F339 Major depressive disorder, recurrent, unspecified: Secondary | ICD-10-CM

## 2024-03-03 ENCOUNTER — Other Ambulatory Visit: Payer: Self-pay | Admitting: Adult Health

## 2024-04-29 ENCOUNTER — Other Ambulatory Visit: Payer: Self-pay | Admitting: Adult Health

## 2024-04-29 DIAGNOSIS — I10 Essential (primary) hypertension: Secondary | ICD-10-CM

## 2024-04-30 ENCOUNTER — Telehealth: Payer: Self-pay

## 2024-04-30 MED ORDER — TAMSULOSIN HCL 0.4 MG PO CAPS: 0.4000 mg | ORAL_CAPSULE | Freq: Every evening | ORAL | 2 refills | Status: AC

## 2024-04-30 NOTE — Telephone Encounter (Signed)
 Left message on voicemail for patient to return call when available  or suggested that he send a mychart message if he has a need to be met

## 2024-04-30 NOTE — Telephone Encounter (Signed)
 Patient walked in to the office stating that he went the pharmacy and asking for our office to sent in the office but the pharmacy stating they sent it to our office but it never got done.  RX was sent to the pharmacy.

## 2024-04-30 NOTE — Telephone Encounter (Signed)
 Copied from CRM #[id number]. Topic: General - Other >> Apr 30, 2024  1:38 PM Miquel SAILOR wrote: Reason for CRM: PT calling to speak to [facility] in office was just I office today. Called office spoke to Darice stated shes not available but will get someone to assist him. I went back to Pt to explain but when I explained he hung up. Pt needs call back [phone]

## 2024-06-29 ENCOUNTER — Telehealth: Payer: Self-pay

## 2024-06-29 ENCOUNTER — Other Ambulatory Visit: Payer: Self-pay

## 2024-06-29 LAB — LIPID PANEL
Cholesterol: 138 mg/dL
HDL: 46 mg/dL
LDL Cholesterol (Calc): 67 mg/dL
Non-HDL Cholesterol (Calc): 92 mg/dL
Total CHOL/HDL Ratio: 3 (calc)
Triglycerides: 177 mg/dL — ABNORMAL HIGH

## 2024-06-29 NOTE — Telephone Encounter (Signed)
 Patient states he stopped tamsulosin  a couple of weeks ago because the insurance would not pay for it. Patient state he plans to discuss this with his PC on Friday at upcoming appointment

## 2024-06-30 LAB — COMPREHENSIVE METABOLIC PANEL WITH GFR
AG Ratio: 1.6 (calc) (ref 1.0–2.5)
ALT: 10 U/L (ref 9–46)
AST: 15 U/L (ref 10–35)
Albumin: 4.4 g/dL (ref 3.6–5.1)
Alkaline phosphatase (APISO): 49 U/L (ref 35–144)
BUN/Creatinine Ratio: 9 (calc) (ref 6–22)
BUN: 14 mg/dL (ref 7–25)
CO2: 28 mmol/L (ref 20–32)
Calcium: 10.3 mg/dL (ref 8.6–10.3)
Chloride: 103 mmol/L (ref 98–110)
Creat: 1.51 mg/dL — ABNORMAL HIGH (ref 0.70–1.22)
Globulin: 2.7 g/dL (ref 1.9–3.7)
Glucose, Bld: 124 mg/dL — ABNORMAL HIGH (ref 65–99)
Potassium: 5.2 mmol/L (ref 3.5–5.3)
Sodium: 137 mmol/L (ref 135–146)
Total Bilirubin: 0.3 mg/dL (ref 0.2–1.2)
Total Protein: 7.1 g/dL (ref 6.1–8.1)
eGFR: 46 mL/min/1.73m2 — ABNORMAL LOW

## 2024-06-30 LAB — HEMOGLOBIN A1C
Hgb A1c MFr Bld: 7.1 % — ABNORMAL HIGH
Mean Plasma Glucose: 157 mg/dL
eAG (mmol/L): 8.7 mmol/L

## 2024-06-30 LAB — VITAMIN D 25 HYDROXY (VIT D DEFICIENCY, FRACTURES): Vit D, 25-Hydroxy: 30 ng/mL (ref 30–100)

## 2024-06-30 NOTE — Progress Notes (Signed)
-   Triglycerides 177, down from 185 6 months ago - LDL and cholesterol normal    -   Vitamin D  level normal GFR 46, ranging as chronic kidney disease stage III A, same as 6 months ago -   A1c 7.1, up from 6.9, can be discussed during upcoming visit if wanting to start on diabetic medication -   PTH and calcium  elevated, I have referred him to endocrinologist previously and wanting to know if you have not followed up

## 2024-07-03 ENCOUNTER — Ambulatory Visit: Admitting: Adult Health

## 2024-07-03 ENCOUNTER — Encounter: Payer: Self-pay | Admitting: Adult Health

## 2024-07-03 VITALS — BP 116/58 | HR 76 | Temp 97.3°F | Ht 71.0 in | Wt 193.2 lb

## 2024-07-03 DIAGNOSIS — R350 Frequency of micturition: Secondary | ICD-10-CM

## 2024-07-03 DIAGNOSIS — N1831 Chronic kidney disease, stage 3a: Secondary | ICD-10-CM | POA: Diagnosis not present

## 2024-07-03 DIAGNOSIS — K219 Gastro-esophageal reflux disease without esophagitis: Secondary | ICD-10-CM | POA: Diagnosis not present

## 2024-07-03 DIAGNOSIS — E781 Pure hyperglyceridemia: Secondary | ICD-10-CM

## 2024-07-03 DIAGNOSIS — Z7984 Long term (current) use of oral hypoglycemic drugs: Secondary | ICD-10-CM

## 2024-07-03 DIAGNOSIS — F339 Major depressive disorder, recurrent, unspecified: Secondary | ICD-10-CM

## 2024-07-03 DIAGNOSIS — E559 Vitamin D deficiency, unspecified: Secondary | ICD-10-CM | POA: Diagnosis not present

## 2024-07-03 DIAGNOSIS — E1122 Type 2 diabetes mellitus with diabetic chronic kidney disease: Secondary | ICD-10-CM | POA: Diagnosis not present

## 2024-07-03 DIAGNOSIS — I10 Essential (primary) hypertension: Secondary | ICD-10-CM | POA: Diagnosis not present

## 2024-07-03 DIAGNOSIS — N401 Enlarged prostate with lower urinary tract symptoms: Secondary | ICD-10-CM

## 2024-07-03 MED ORDER — VITAMIN D3 25 MCG (1000 UT) PO CAPS: 1000.0000 [IU] | ORAL_CAPSULE | Freq: Every day | ORAL | Status: AC

## 2024-07-03 NOTE — Patient Instructions (Signed)
" °  Please schedule your annual medicare wellness visit. "

## 2024-07-03 NOTE — Progress Notes (Signed)
 "  [facility]  Provider:  Jereld Serum DNP  Code Status:  DNR  Goals of Care:     01/06/2024    9:03 AM  Advanced Directives  Does Patient Have a Medical Advance Directive? Yes  Type of Advance Directive Out of facility DNR (pink MOST or yellow form)  Does patient want to make changes to medical advance directive? No - Patient declined  Pre-existing out of facility DNR order (yellow form or pink MOST form) Yellow form placed in chart (order not valid for inpatient use)     Chief Complaint  Patient presents with   Medical Management of Chronic Issues    6 month follow up. Off balance here lately. He will be following up with urologist in reference to frequent urination upon standing  Cold all the time    Discussed the use of AI scribe software for clinical note transcription with the patient, who gave verbal consent to proceed.   HPI: Patient is a 79 y.o. male seen today for a 17-month follow up of chronic medical issues.  He experiences persistent cold intolerance, requiring multiple layers of clothing and blankets for warmth. This sensation has been ongoing for a long time without any new associated symptoms.  He has a history of hyperlipidemia and is currently taking rosuvastatin  10 mg daily. His cholesterol levels are normal, but triglycerides remain elevated at 177 mg/dL, slightly down from 814 mg/dL six months ago. He consumes a high sugar diet and has not been adhering to a specific diet or exercise regimen.  He has type 2 diabetes, managed with metformin  1000 mg twice daily. His A1c has increased from 6.9% six months ago to 7.1% recently. He prefers sugary foods and has been managing diabetes for 25 years.  His kidney function has been stable, with a GFR of 46, down from 55. He experiences increased urinary urgency and frequency without pain, blood, or chills associated with urination. He takes tamsulosin  0.4 mg daily for benign prostatic hyperplasia.  He  experiences frequent bowel movements, often coinciding with urination, occurring three to four times daily. No constipation is reported, but there is variability in stool consistency.  He has a history of depression, currently taking Zoloft  50 mg daily. His PHQ-9 score is 8.  He has hypertension, managed with ramipril  5 mg daily, and reports stable blood pressure readings.  He has a history of acid reflux, managed with Protonix  40 mg daily, and uses two pillows to elevate his upper body during sleep.  He does not currently take vitamin D  supplements, although his levels are sufficient at 30 ng/mL.    Past Medical History:  Diagnosis Date   Allergy    Anemia    Anxiety    Depression    Diabetes mellitus without complication (HCC)    GERD (gastroesophageal reflux disease)    History of kidney stones 1971   Hyperlipidemia    Hypertension    Sleep apnea     Past Surgical History:  Procedure Laterality Date   APPENDECTOMY  1952   CYSTOSCOPY N/A 01/09/2023   Procedure: CYSTOSCOPY;  Surgeon: Devere Lonni Righter, MD;  Location: [facility];  Service: Urology;  Laterality: N/A;   ENTEROSCOPY N/A 11/16/2021   Procedure: ENTEROSCOPY;  Surgeon: Stacia Glendia BRAVO, MD;  Location: [facility];  Service: Gastroenterology;  Laterality: N/A;   HEMOSTASIS CLIP PLACEMENT  11/16/2021   Procedure: HEMOSTASIS CLIP PLACEMENT;  Surgeon: Stacia Glendia BRAVO, MD;  Location: [facility];  Service: Gastroenterology;;  HOT HEMOSTASIS N/A 11/16/2021   Procedure: HOT HEMOSTASIS (ARGON PLASMA COAGULATION/BICAP);  Surgeon: Stacia Glendia BRAVO, MD;  Location: [facility];  Service: Gastroenterology;  Laterality: N/A;   LUMBAR DISC SURGERY     POLYPECTOMY  11/16/2021   Procedure: POLYPECTOMY;  Surgeon: Stacia Glendia BRAVO, MD;  Location: [facility];  Service: Gastroenterology;;   SUBMUCOSAL LIFTING INJECTION  11/16/2021   Procedure: SUBMUCOSAL LIFTING INJECTION;  Surgeon: Stacia Glendia BRAVO, MD;  Location: [facility];  Service: Gastroenterology;;    Allergies[1]  Outpatient Encounter Medications as of 07/03/2024  Medication Sig   Cholecalciferol (VITAMIN D3) 25 MCG (1000 UT) CAPS Take 1 capsule (1,000 Units total) by mouth daily.   metFORMIN  (GLUCOPHAGE ) 1000 MG tablet TAKE 1 TABLET(1000 MG) BY MOUTH TWICE DAILY   pantoprazole  (PROTONIX ) 40 MG tablet TAKE 1 TABLET(40 MG) BY MOUTH DAILY   ramipril  (ALTACE ) 5 MG capsule TAKE 1 CAPSULE(5 MG) BY MOUTH DAILY   rosuvastatin  (CRESTOR ) 10 MG tablet TAKE 1 TABLET(10 MG) BY MOUTH DAILY   sertraline  (ZOLOFT ) 50 MG tablet TAKE 1 TABLET(50 MG) BY MOUTH DAILY   tamsulosin  (FLOMAX ) 0.4 MG CAPS capsule Take 1 capsule (0.4 mg total) by mouth at bedtime. (Patient not taking: Reported on 07/03/2024)   No facility-administered encounter medications on file as of 07/03/2024.    Review of Systems:  Review of Systems  Constitutional:  Negative for activity change, appetite change and fever.  HENT:  Negative for sore throat.   Eyes: Negative.   Cardiovascular:  Negative for chest pain and leg swelling.  Gastrointestinal:  Negative for abdominal distention, diarrhea and vomiting.  Genitourinary:  Negative for dysuria, frequency and urgency.  Skin:  Negative for color change.  Neurological:  Negative for dizziness and headaches.  Psychiatric/Behavioral:  Negative for behavioral problems and sleep disturbance. The patient is not nervous/anxious.     Health Maintenance  Topic Date Due   Colonoscopy  10/25/2024   OPHTHALMOLOGY EXAM  07/25/2024   COVID-19 Vaccine (12 - 2025-26 season) 12/14/2024   FOOT EXAM  12/26/2024   HEMOGLOBIN A1C  12/27/2024   Medicare Annual Wellness (AWV)  01/05/2025   Diabetic kidney evaluation - eGFR measurement  06/29/2025   Diabetic kidney evaluation - Urine ACR  07/03/2025   Influenza Vaccine  Completed   Meningococcal B Vaccine  Aged Out   DTaP/Tdap/Td  Discontinued   Pneumococcal Vaccine: 50+ Years  Discontinued   Hepatitis C  Screening  Discontinued   Zoster Vaccines- Shingrix  Discontinued    Physical Exam: Vitals:   07/03/24 0951  BP: (!) 116/58  Pulse: 76  Temp: (!) 97.3 F (36.3 C)  TempSrc: Esophageal  SpO2: 93%  Weight: 193 lb 3.2 oz (87.6 kg)  Height: 5' 11 (1.803 m)   Body mass index is 26.95 kg/m. Physical Exam Constitutional:      Appearance: Normal appearance.  HENT:     Head: Normocephalic and atraumatic.     Mouth/Throat:     Mouth: Mucous membranes are moist.  Eyes:     Conjunctiva/sclera: Conjunctivae normal.  Cardiovascular:     Rate and Rhythm: Normal rate and regular rhythm.     Pulses: Normal pulses.     Heart sounds: Normal heart sounds.  Pulmonary:     Effort: Pulmonary effort is normal.     Breath sounds: Normal breath sounds.  Abdominal:     General: Bowel sounds are normal.     Palpations: Abdomen is soft.  Musculoskeletal:  General: No swelling. Normal range of motion.     Cervical back: Normal range of motion.  Skin:    General: Skin is warm and dry.  Neurological:     General: No focal deficit present.     Mental Status: He is alert and oriented to person, place, and time.  Psychiatric:        Mood and Affect: Mood normal.        Behavior: Behavior normal.        Thought Content: Thought content normal.        Judgment: Judgment normal.     Labs reviewed: Basic Metabolic Panel: Recent Labs    12/19/23 0845 06/29/24 0942  NA 137 137  K 4.7 5.2  CL 102 103  CO2 22 28  GLUCOSE 121* 124*  BUN 18 14  CREATININE 1.31* 1.51*  CALCIUM  10.4*  10.4* 10.3   Liver Function Tests: Recent Labs    12/19/23 0845 06/29/24 0942  AST 12 15  ALT 8* 10  BILITOT 0.4 0.3  PROT 6.6 7.1   No results for input(s): LIPASE, AMYLASE in the last 8760 hours. No results for input(s): AMMONIA in the last 8760 hours. CBC: No results for input(s): WBC, NEUTROABS, HGB, HCT, MCV, PLT in the last 8760 hours. Lipid Panel: Recent Labs     12/19/23 0845 06/29/24 0943  CHOL 135 138  HDL 41 46  LDLCALC 68 67  TRIG 185* 177*  CHOLHDL 3.3 3.0   Lab Results  Component Value Date   HGBA1C 7.1 (H) 06/29/2024    Procedures since last visit: No results found.  Assessment/Plan  1. Type 2 diabetes mellitus with stage 3a chronic kidney disease, without long-term current use of insulin  (HCC) (Primary) -  A1c increased to 7.1, indicating suboptimal glycemic control. GFR decreased to 46, consistent with stage 3a CKD. - Continue metformin  1000 mg twice daily. - Advised low carbohydrate diet and reduction of sugary foods. - Microalbumin/Creatinine Ratio, Urine  2. Benign prostatic hyperplasia with urinary frequency -  Increased urinary frequency and urgency suggest BPH exacerbation. - Continue tamsulosin  0.4 mg daily. - Discuss urinary symptoms with urologist.  3. Vitamin D  deficiency -  Vitamin D  level is 30, at the lower limit of normal. - Recommended vitamin D  1000 IU daily. - Cholecalciferol (VITAMIN D3) 25 MCG (1000 UT) CAPS; Take 1 capsule (1,000 Units total) by mouth daily.  4. Primary hypertension -  Blood pressure well-controlled at 116/58 mmHg. - Continue ramipril  5 mg daily.  5. Major depression, recurrent, chronic -  PHQ-9 score is 8, indicating mild depression. - Continue sertraline  50 mg daily.  6. Gastroesophageal reflux disease without esophagitis - Continue pantoprazole  40 mg daily.   7. Hypertriglyceridemia -  Triglycerides decreased to 177 but remain above target. High sugar intake contributes to elevated levels. - Continue rosuvastatin  10 mg daily. - Advised increased exercise and dietary modifications, including increased vegetable intake and reduced sugar consumption. - Consider adding fenofibrate if lifestyle modifications are insufficient.     Labs/tests ordered:   None   Return in about 6 months (around 12/31/2024).  Jaslyne Beeck Medina-Vargas, NP     [1] No Known Allergies  "

## 2024-07-04 LAB — MICROALBUMIN / CREATININE URINE RATIO
Creatinine, Urine: 91 mg/dL (ref 20–320)
Microalb Creat Ratio: 4 mg/g{creat}
Microalb, Ur: 0.4 mg/dL

## 2024-07-05 ENCOUNTER — Ambulatory Visit: Payer: Self-pay | Admitting: Adult Health

## 2024-07-05 NOTE — Progress Notes (Signed)
-    urine microalbumin creatinine ratio is normal.

## 2024-07-06 ENCOUNTER — Ambulatory Visit: Payer: Self-pay | Admitting: Adult Health

## 2024-07-14 ENCOUNTER — Other Ambulatory Visit: Payer: Self-pay | Admitting: Adult Health

## 2024-07-14 DIAGNOSIS — E785 Hyperlipidemia, unspecified: Secondary | ICD-10-CM

## 2024-07-14 DIAGNOSIS — K219 Gastro-esophageal reflux disease without esophagitis: Secondary | ICD-10-CM

## 2025-01-07 ENCOUNTER — Encounter: Payer: Self-pay | Admitting: Adult Health

## 2025-02-05 ENCOUNTER — Ambulatory Visit: Admitting: Adult Health
